# Patient Record
Sex: Female | Born: 1975 | Hispanic: No | Marital: Married
Health system: Southern US, Community
[De-identification: ages and names within clinical notes are randomized; demographics above are authoritative.]

---

## 2000-01-14 ENCOUNTER — Inpatient Hospital Stay (HOSPITAL_COMMUNITY): Admission: AD | Admit: 2000-01-14 | Discharge: 2000-01-16 | Payer: Self-pay | Admitting: Obstetrics and Gynecology

## 2000-04-18 ENCOUNTER — Other Ambulatory Visit: Admission: RE | Admit: 2000-04-18 | Discharge: 2000-04-18 | Payer: Self-pay | Admitting: Obstetrics and Gynecology

## 2000-11-24 ENCOUNTER — Other Ambulatory Visit: Admission: RE | Admit: 2000-11-24 | Discharge: 2000-11-24 | Payer: Self-pay | Admitting: Obstetrics and Gynecology

## 2001-07-28 ENCOUNTER — Other Ambulatory Visit: Admission: RE | Admit: 2001-07-28 | Discharge: 2001-07-28 | Payer: Self-pay | Admitting: Obstetrics and Gynecology

## 2001-08-31 ENCOUNTER — Encounter: Admission: RE | Admit: 2001-08-31 | Discharge: 2001-08-31 | Payer: Self-pay | Admitting: Family Medicine

## 2001-08-31 ENCOUNTER — Encounter: Payer: Self-pay | Admitting: Family Medicine

## 2002-02-26 ENCOUNTER — Other Ambulatory Visit: Admission: RE | Admit: 2002-02-26 | Discharge: 2002-02-26 | Payer: Self-pay | Admitting: Obstetrics and Gynecology

## 2004-04-17 ENCOUNTER — Ambulatory Visit: Payer: Self-pay | Admitting: Family Medicine

## 2004-04-20 ENCOUNTER — Ambulatory Visit: Payer: Self-pay | Admitting: Family Medicine

## 2016-11-02 IMAGING — CR DG HIP (WITH OR WITHOUT PELVIS) 2-3V*R*
2 series · 2 of 2 positions shown · non-contrast
Comparison: None.

CLINICAL DATA: Increasing right hip pain for several months. No
known injury.

EXAM:
DG HIP (WITH OR WITHOUT PELVIS) 2-3V RIGHT

[w hip ap right]
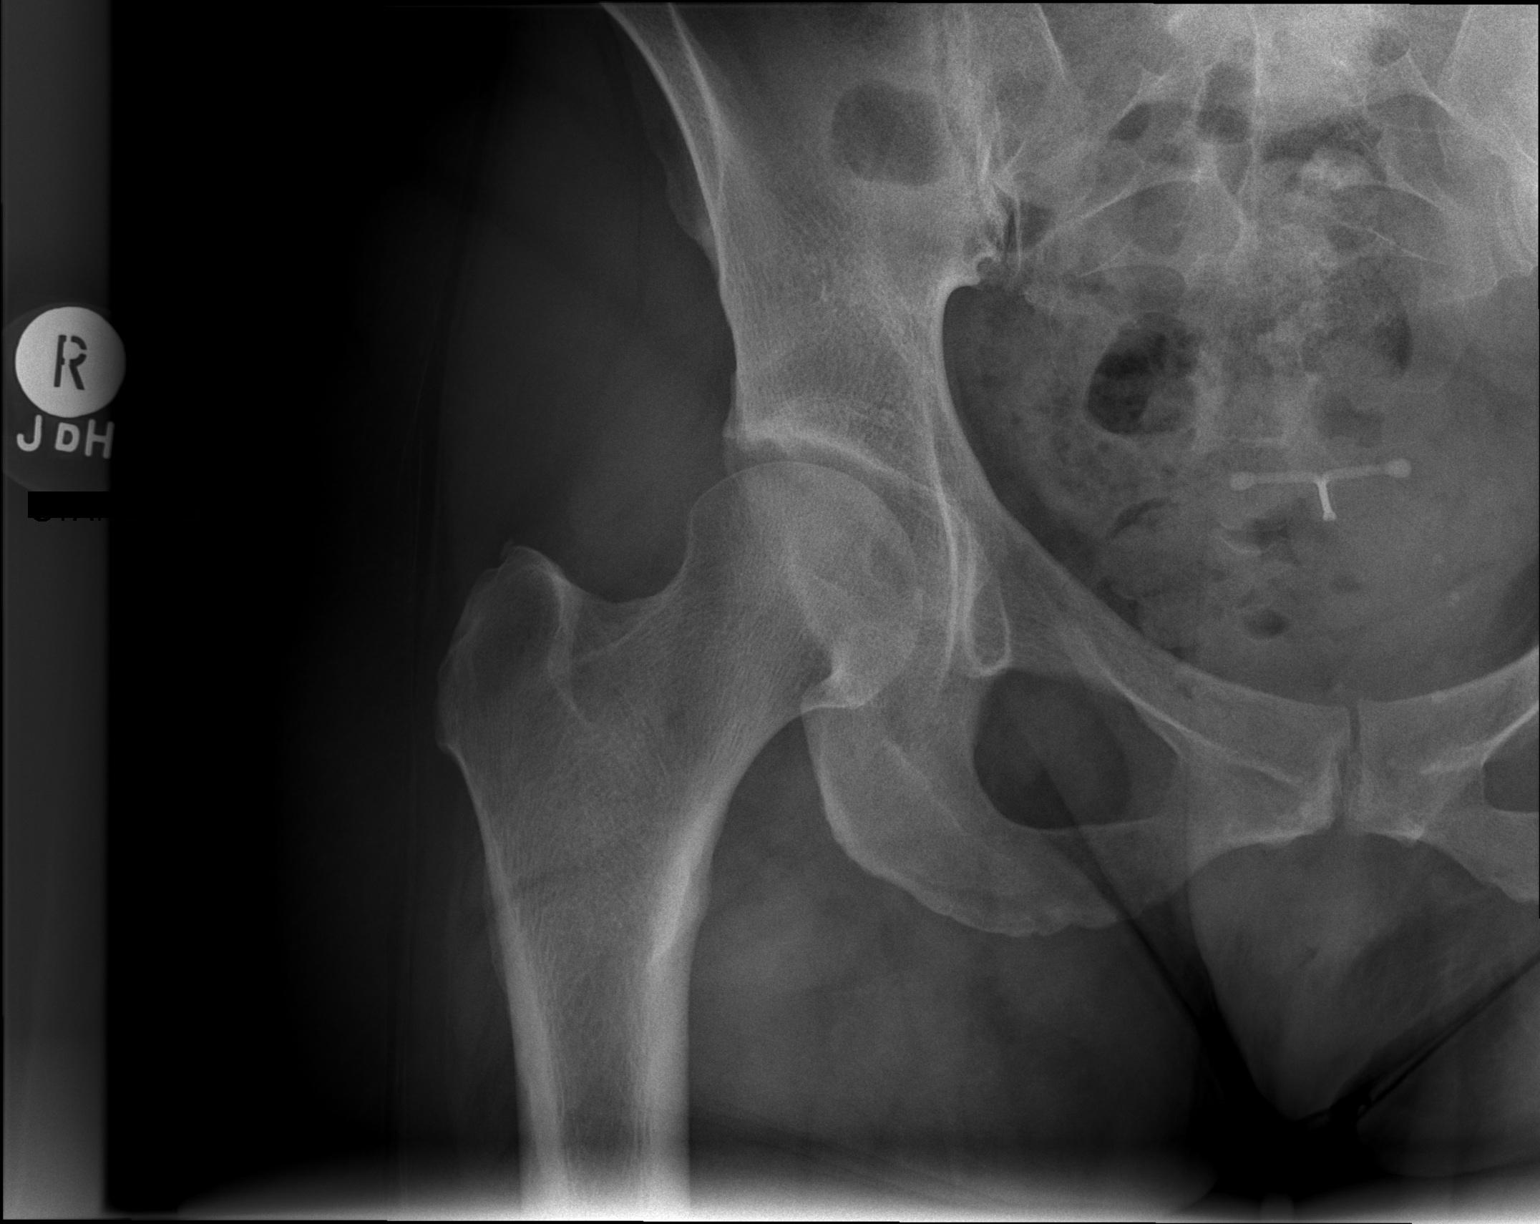

[w hip frog right]
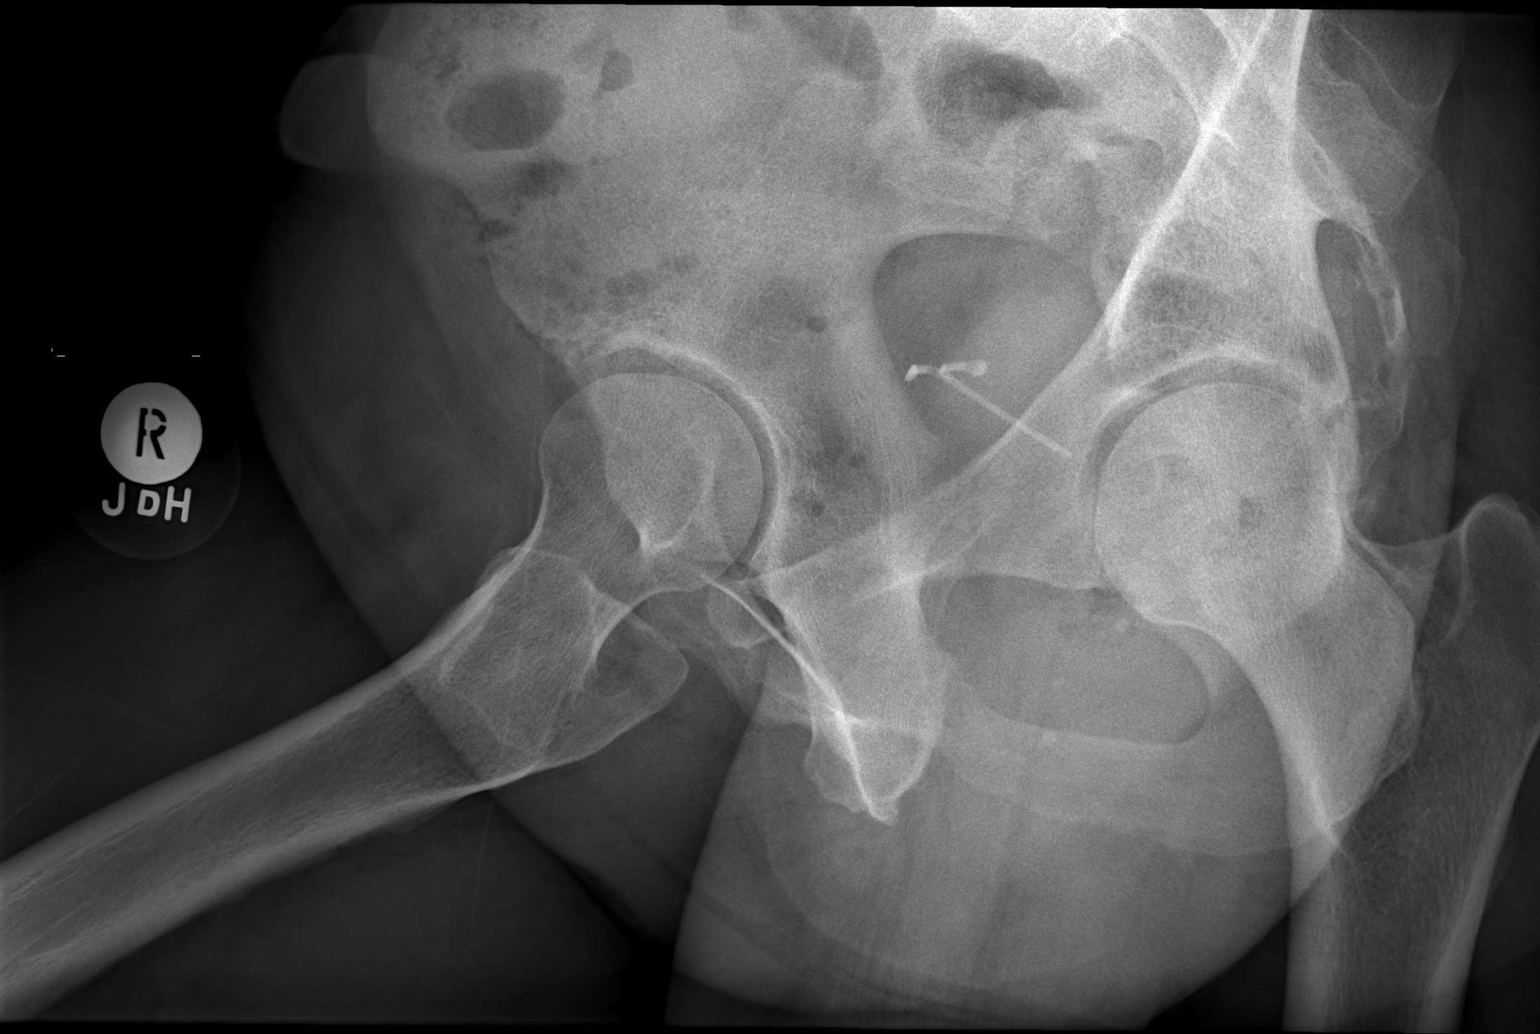

[2 of 2 positions shown; findings below may reference images not displayed]

FINDINGS: There is no evidence of hip fracture or dislocation. There is no
evidence of arthropathy or other focal bone abnormality. Pelvic IUD
noted.
IMPRESSION: Negative.

## 2018-04-01 ENCOUNTER — Other Ambulatory Visit: Payer: Self-pay | Admitting: Family Medicine

## 2018-04-01 ENCOUNTER — Ambulatory Visit
Admission: RE | Admit: 2018-04-01 | Discharge: 2018-04-01 | Disposition: A | Discharge status: Home / Self Care | Payer: Self-pay | Source: Ambulatory Visit | Attending: Family Medicine | Admitting: Family Medicine

## 2018-04-01 DIAGNOSIS — R52 Pain, unspecified: Secondary | ICD-10-CM

## 2018-04-01 IMAGING — CR DG LUMBAR SPINE COMPLETE 4+V
5 series · 5 of 5 positions shown · non-contrast
Comparison: None.

CLINICAL DATA: Chronic lumbago with right-sided radicular symptoms

EXAM:
LUMBAR SPINE - COMPLETE 4+ VIEW

[w lumbar spine ap]
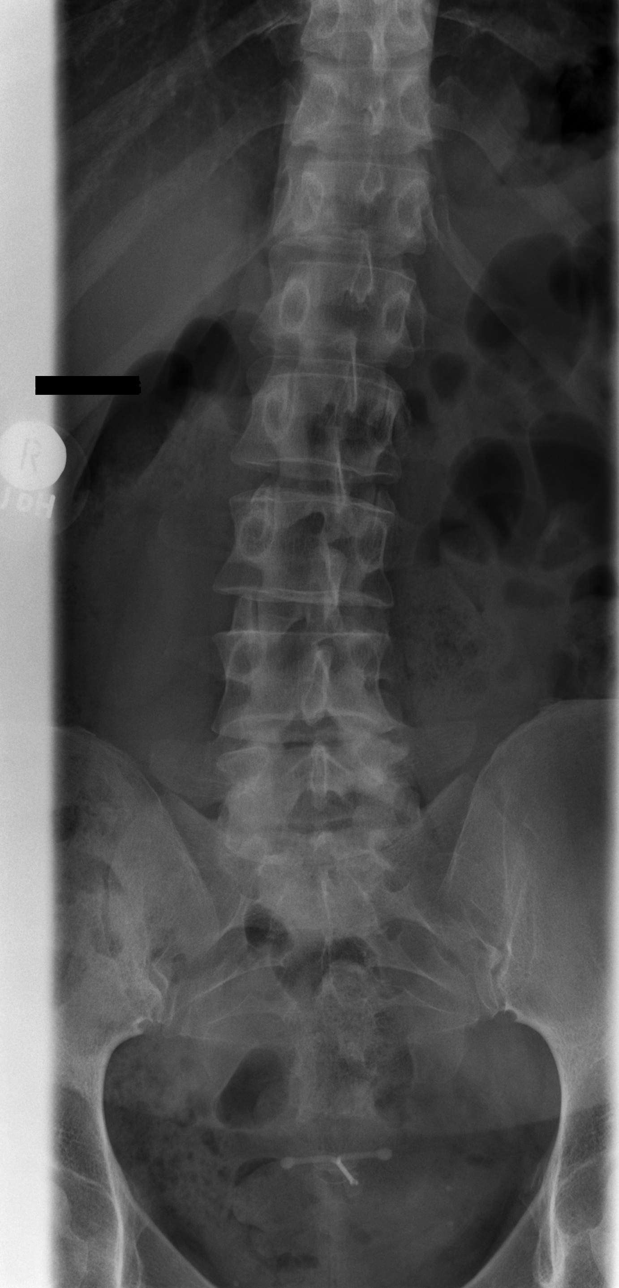

[w lumbar spine obl (1 of 2)]
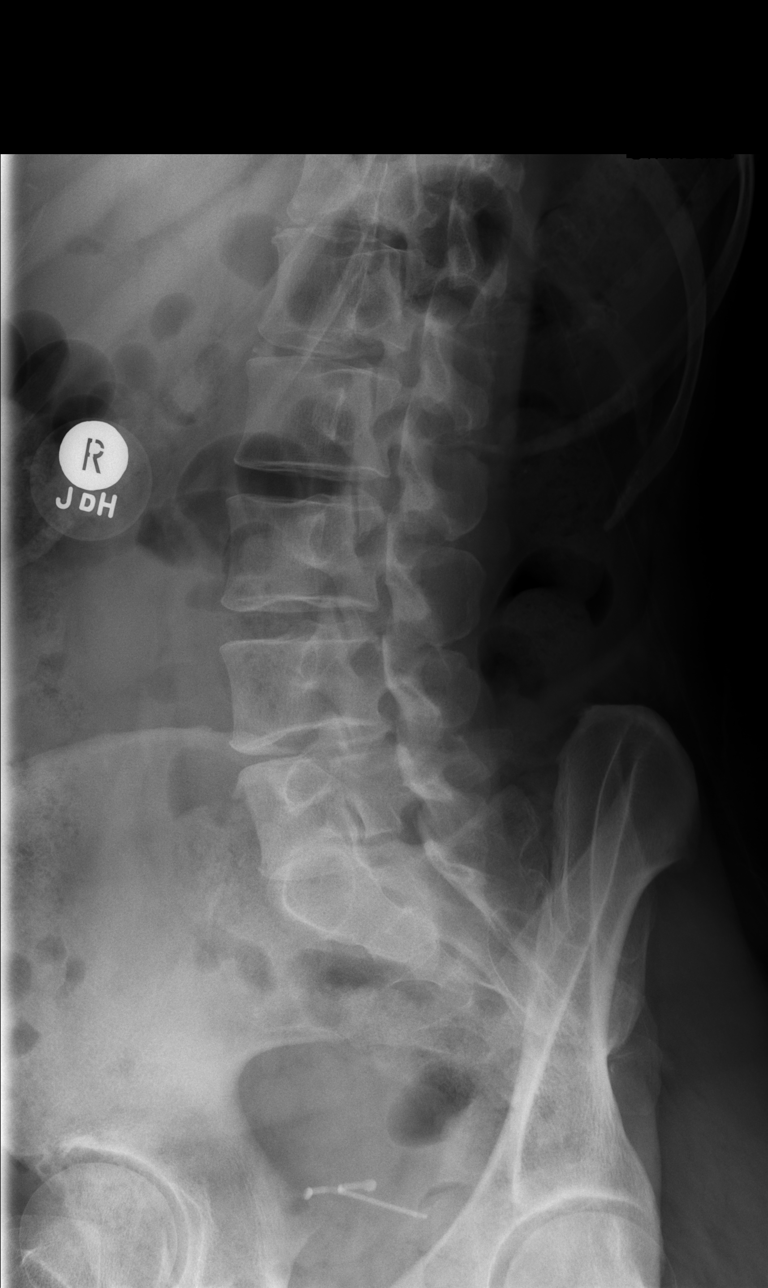

[w lumbar spine obl (2 of 2)]
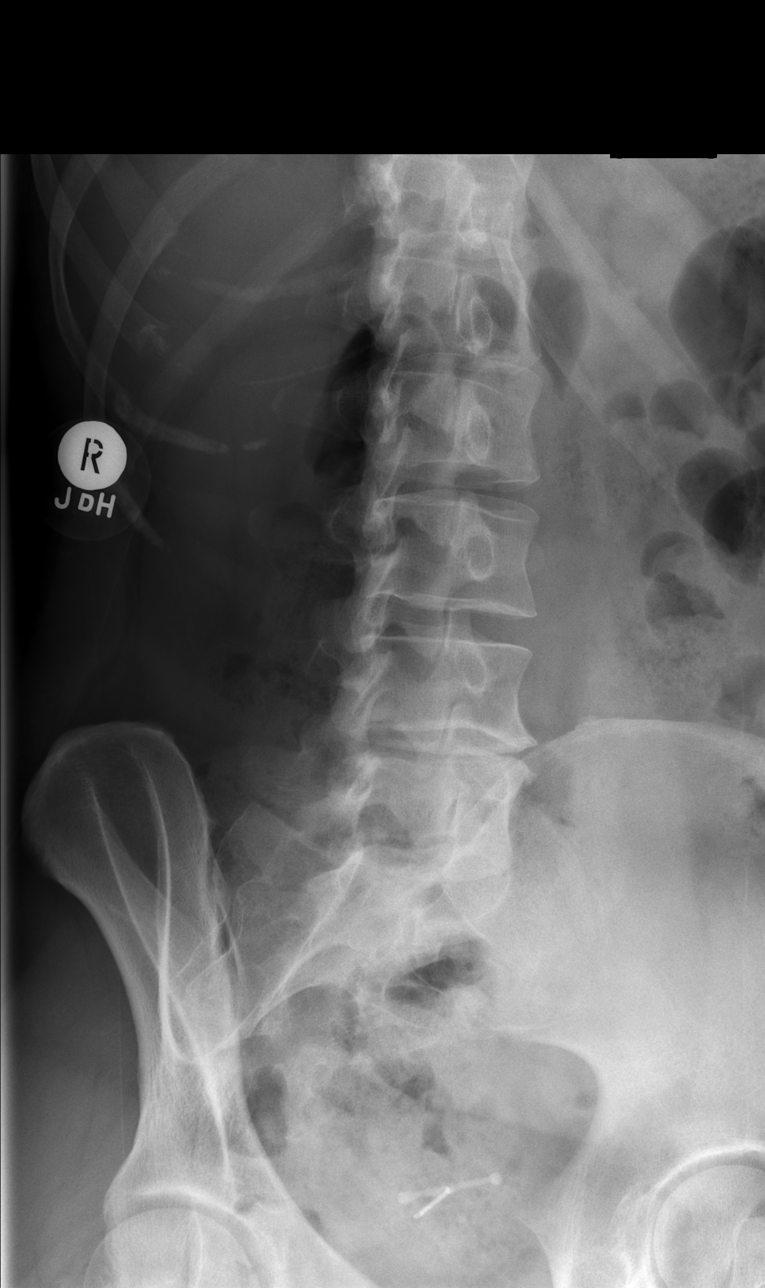

[w lumbar spine lat]
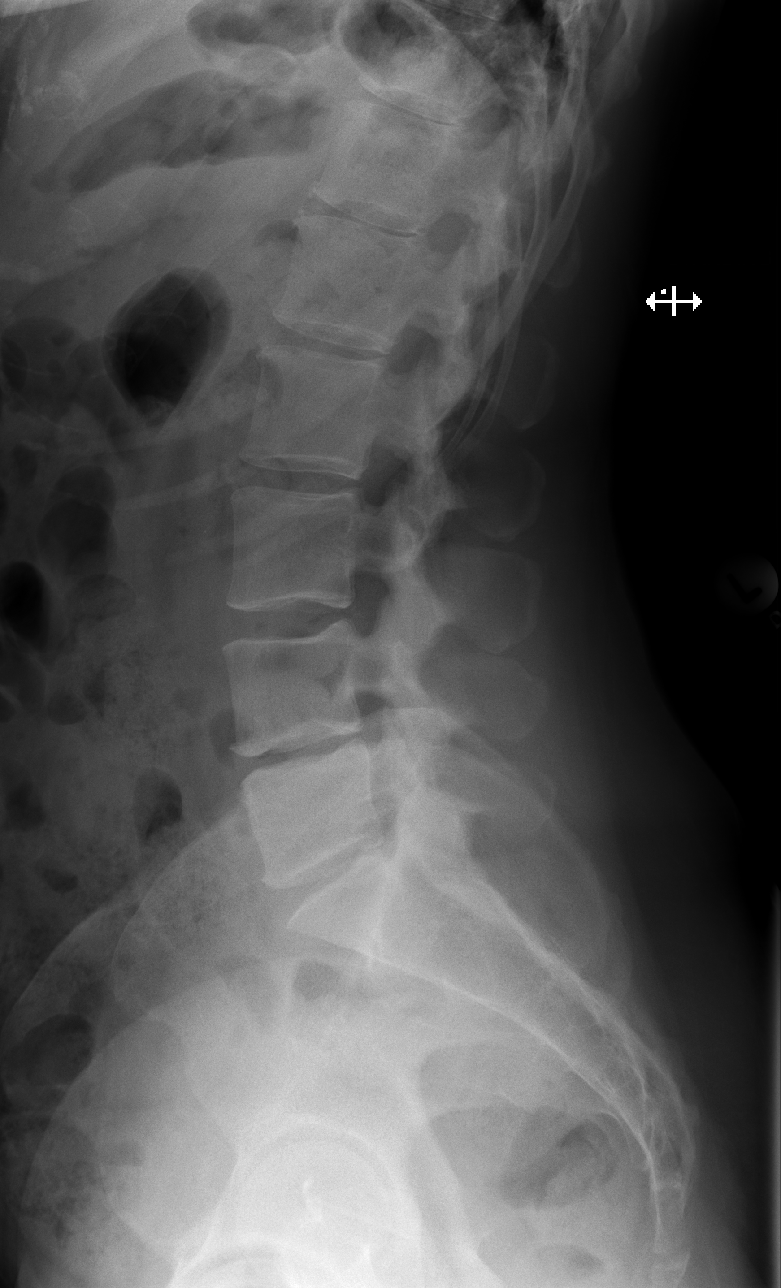

[w lumbar l-5 s-1 spot]
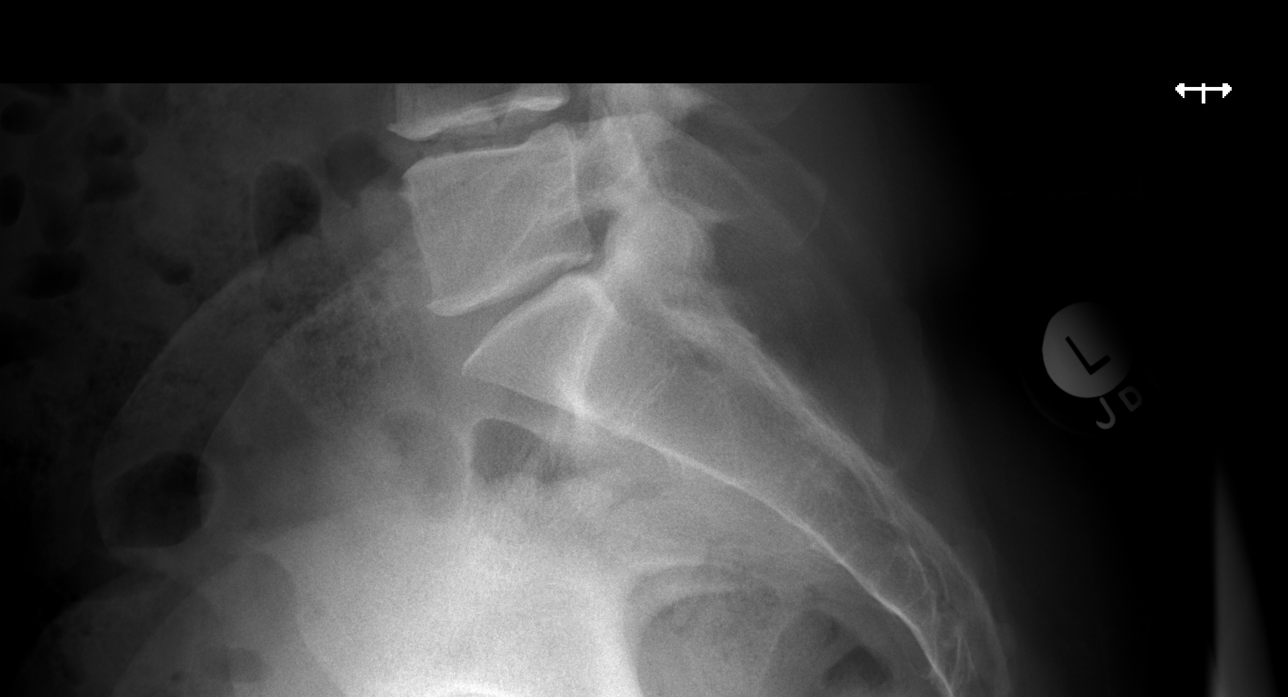

[5 of 5 positions shown; findings below may reference images not displayed]

FINDINGS: Standing frontal, standing lateral, standing spot lumbosacral
lateral, and standing bilateral oblique views were obtained. There
are 5 non-rib-bearing lumbar type vertebral bodies. There is lower
lumbar dextroscoliosis with mild rotatory component. There is no
demonstrable fracture. There is 2 mm of retrolisthesis of L5 on S1.
There is no other appreciable spondylolisthesis. There is moderately
severe disc space narrowing at L4-5 and L5-S1. Other disc spaces
appear unremarkable. There is facet osteoarthritic change on the
left at L5-S1.

There is an intrauterine device in the mid-pelvis.
IMPRESSION: Scoliosis with osteoarthritic change at L4-5 and L5-S1. Slight
spondylolisthesis at L5-S1 is felt to be due to underlying
spondylosis. No evident fracture.

Intrauterine device positioned in the mid-pelvis.

## 2018-09-02 ENCOUNTER — Other Ambulatory Visit (HOSPITAL_COMMUNITY): Payer: Self-pay | Admitting: Family Medicine

## 2018-09-02 ENCOUNTER — Other Ambulatory Visit: Payer: Self-pay | Admitting: Family Medicine

## 2018-09-02 DIAGNOSIS — M5416 Radiculopathy, lumbar region: Secondary | ICD-10-CM

## 2018-09-08 ENCOUNTER — Ambulatory Visit (HOSPITAL_COMMUNITY)
Admission: RE | Admit: 2018-09-08 | Discharge: 2018-09-08 | Disposition: A | Discharge status: Home / Self Care | Payer: No Typology Code available for payment source | Source: Ambulatory Visit | Attending: Family Medicine | Admitting: Family Medicine

## 2018-09-08 ENCOUNTER — Other Ambulatory Visit: Payer: Self-pay

## 2018-09-08 ENCOUNTER — Encounter (HOSPITAL_COMMUNITY): Payer: Self-pay

## 2018-09-08 DIAGNOSIS — M5416 Radiculopathy, lumbar region: Secondary | ICD-10-CM

## 2018-09-09 ENCOUNTER — Ambulatory Visit (HOSPITAL_COMMUNITY)
Admission: RE | Admit: 2018-09-09 | Discharge: 2018-09-09 | Disposition: A | Discharge status: Home / Self Care | Payer: No Typology Code available for payment source | Source: Ambulatory Visit | Attending: Family Medicine | Admitting: Family Medicine

## 2018-09-09 DIAGNOSIS — M5416 Radiculopathy, lumbar region: Secondary | ICD-10-CM | POA: Diagnosis not present

## 2018-09-09 IMAGING — MR MRI LUMBAR SPINE WITHOUT CONTRAST
4 of 5 series · 18 of 48 positions shown · non-contrast
Comparison: Prior radiographs from [DATE].

CLINICAL DATA: Initial evaluation for chronic low back pain with
numbness extending into the right lower extremity.

EXAM:
MRI LUMBAR SPINE WITHOUT CONTRAST
TECHNIQUE: Multiplanar, multisequence MR imaging of the lumbar spine was
performed. No intravenous contrast was administered.

[Series 3: T1 · sagittal · 4.0mm · 0.51mm/px · 3 of 12 slices shown (1 of 2)]
[im 3/12]
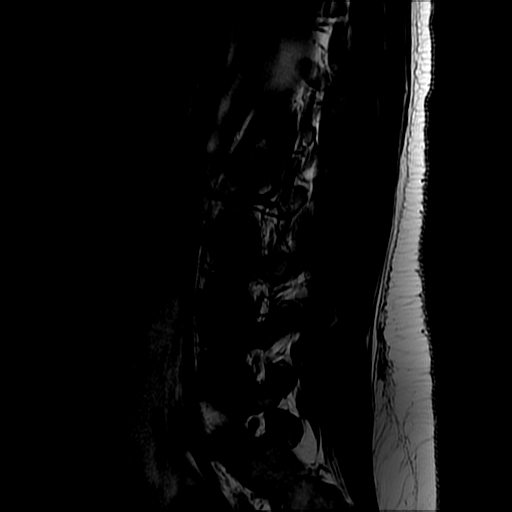
[im 7/12]
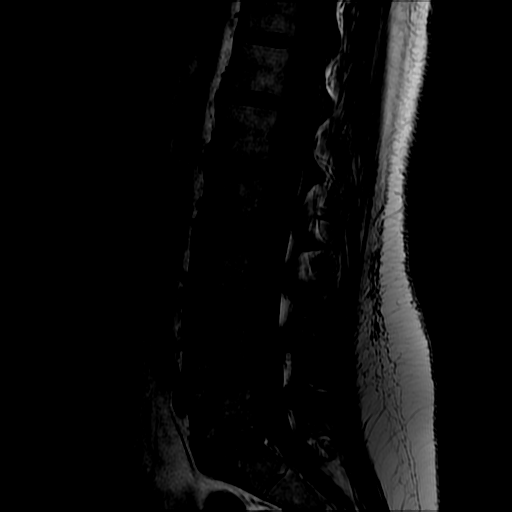
[im 12/12]
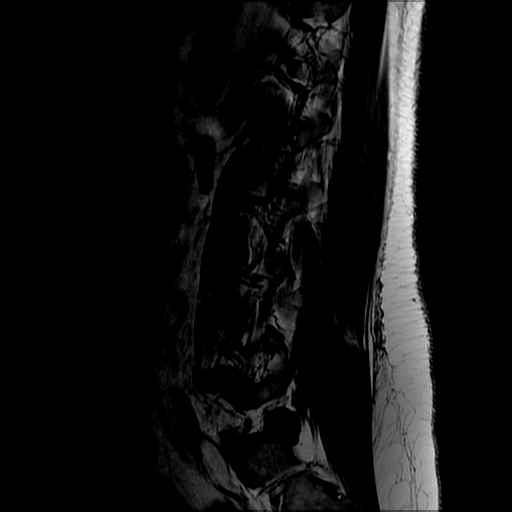

[Series 4: T2 post-contrast · sagittal · 4.0mm · 0.51mm/px · 5 of 12 slices shown]
[im 1/12]
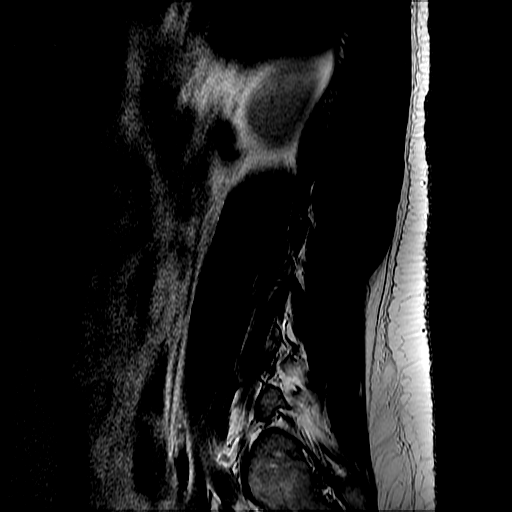
[im 3/12]
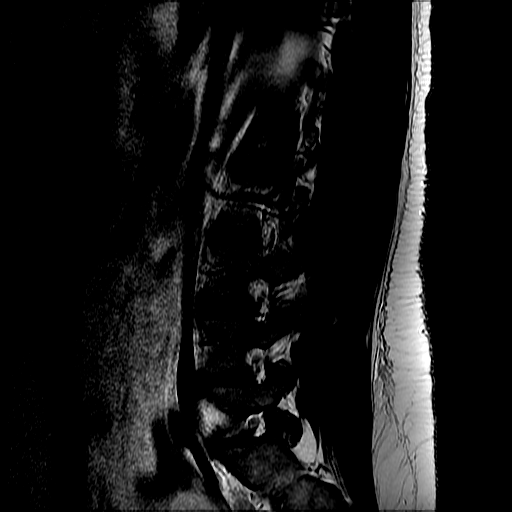
[im 6/12]
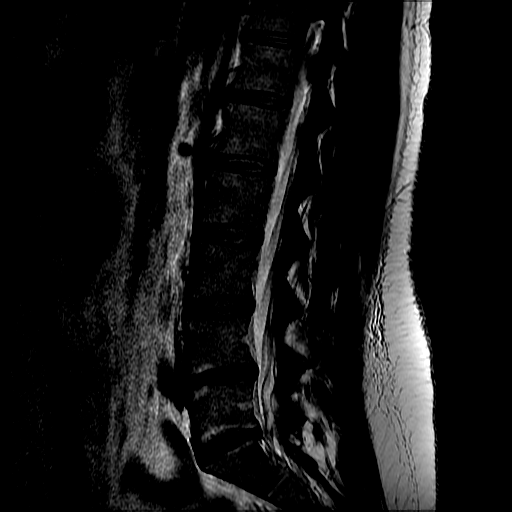
[im 9/12]
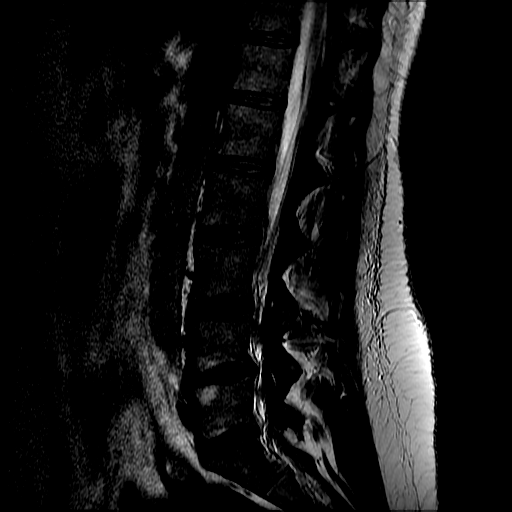
[im 12/12]
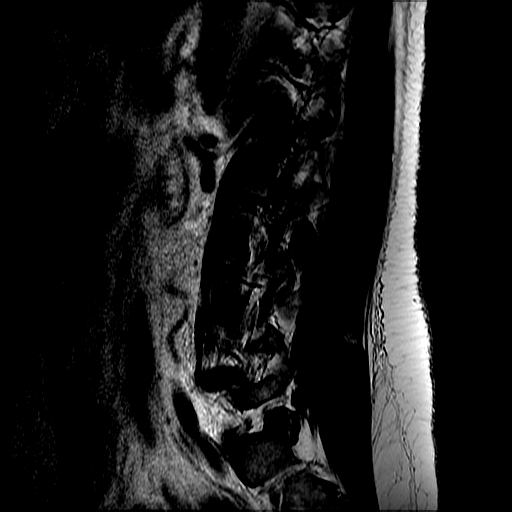

[Series 6: T2 · axial · 4.0mm · 0.39mm/px · z∈[-113,+67]mm · 7 of 37 slices shown]
[im 3/37]
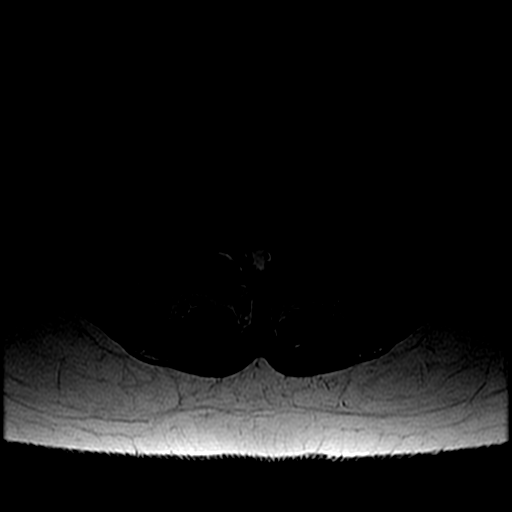
[im 5/37]
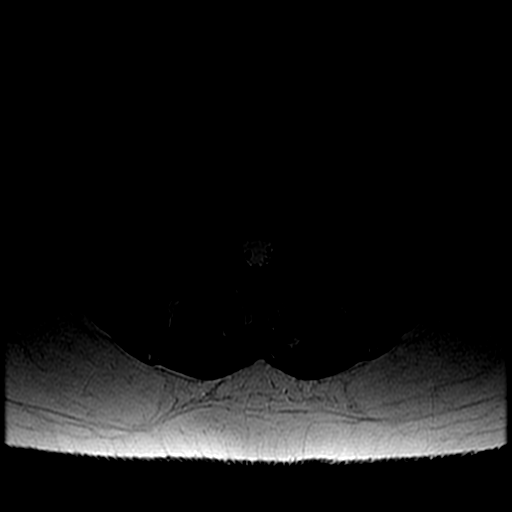
[im 8/37]
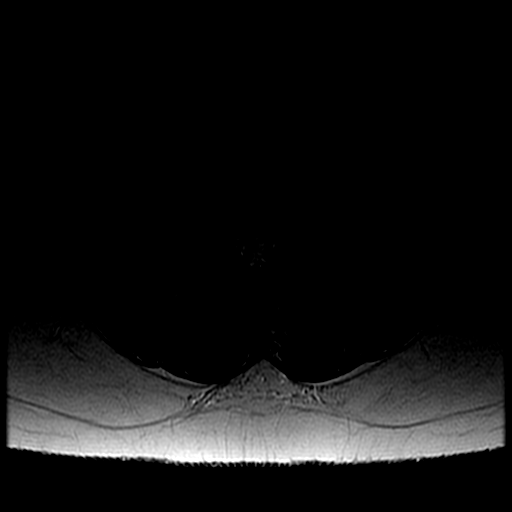
[im 13/37]
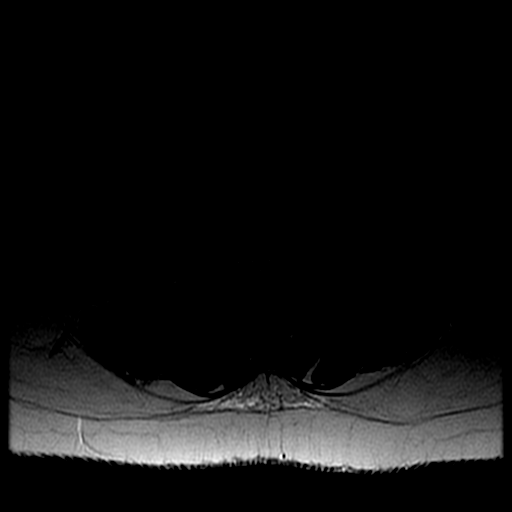
[im 17/37]
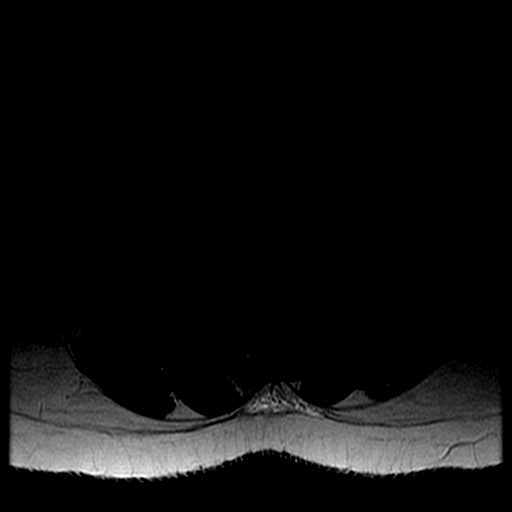
[im 20/37]
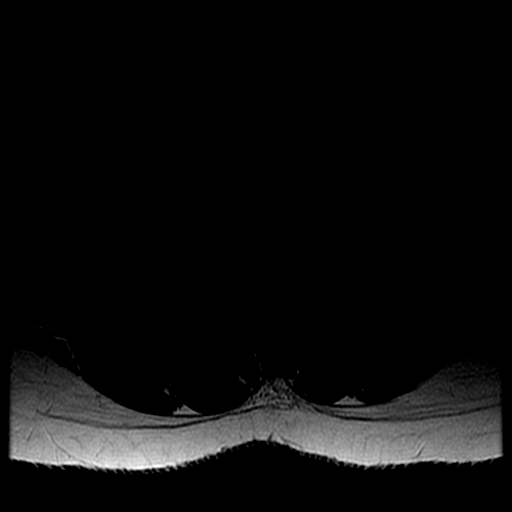
[im 32/37]
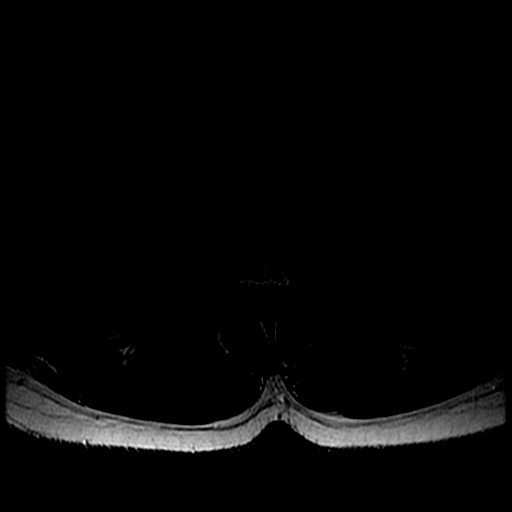

[Series 7: T1 · axial · 4.0mm · 0.39mm/px · z∈[-104,+67]mm · 3 of 37 slices shown (2 of 2)]
[im 5/37]
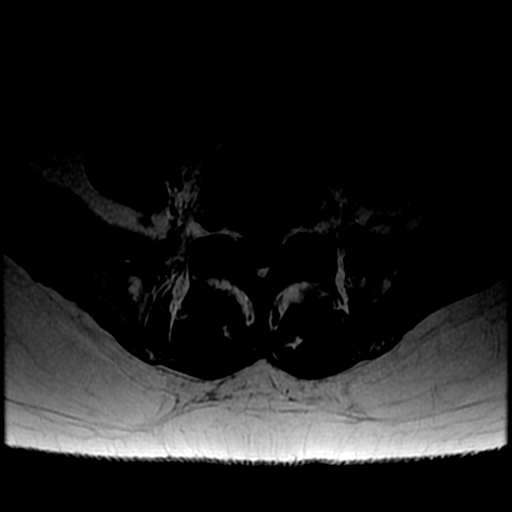
[im 20/37]
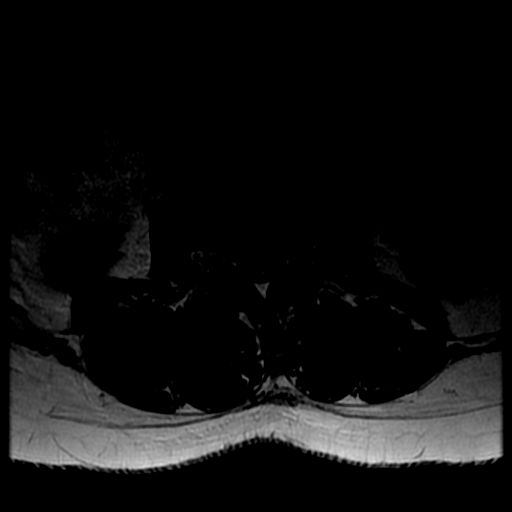
[im 32/37]
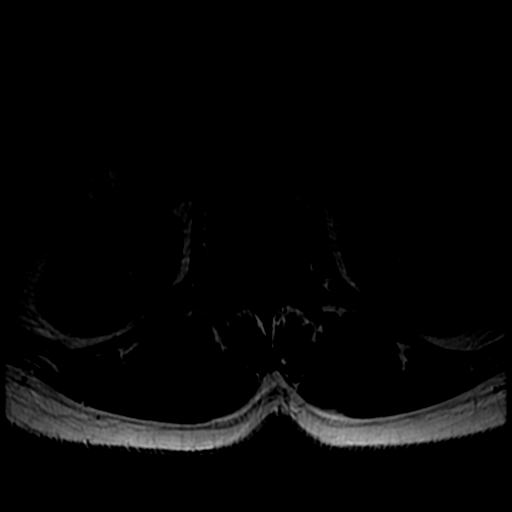

[18 of 48 positions shown; findings below may reference images not displayed]

FINDINGS: Segmentation: Standard. Lowest well-formed disc labeled the L5-S1
level.

Alignment: 3 mm retrolisthesis of L5 on S1, with trace 2 mm
retrolisthesis of L4 on L5. alignment otherwise normal with
preservation of the normal lumbar lordosis.

Vertebrae: Vertebral body height maintained without evidence for
acute or chronic fracture. Bone marrow signal intensity within
normal limits. No discrete or worrisome osseous lesions. Mild
reactive endplate changes present about the L4-5 and L5-S1
interspaces. No other abnormal marrow edema.

Conus medullaris and cauda equina: Conus extends to the L1 level.
Conus and cauda equina appear normal.

Paraspinal and other soft tissues: Paraspinous soft tissues
demonstrate no acute finding. Subcentimeter T2 hyperintense right
renal cyst noted. Visualized visceral structures otherwise
unremarkable.

Disc levels:

L1-2:  Unremarkable.

L2-3:  Unremarkable.

L3-4: Tiny right foraminal disc protrusion closely approximates the
exiting right L3 nerve root without frank impingement (series 4,
image 5). No significant canal or lateral recess stenosis. Foramina
remain patent.

L4-5: Trace retrolisthesis. Chronic intervertebral disc space
narrowing with diffuse disc bulge and disc desiccation. Mild
reactive endplate changes. No significant canal or lateral recess
stenosis. Mild bilateral L4 foraminal narrowing, slightly worse on
the left.

L5-S1: Trace retrolisthesis. Diffuse disc bulge with disc
desiccation and intervertebral disc space narrowing. Mild reactive
endplate changes, greater on the right. Small posterior annular
fissure. Minimal facet hypertrophy. No significant canal or lateral
recess stenosis. Mild right L5 foraminal narrowing.
IMPRESSION: 1. Tiny right foraminal disc protrusion at L3-4, closely
approximating the exiting right L3 nerve root without neural
impingement.
2. Mild disc bulging with reactive endplate changes at L4-5 with
resultant mild bilateral L4 foraminal stenosis.
3. Mild right L5 foraminal narrowing related to disc bulge and
reactive endplate changes.

## 2019-05-19 ENCOUNTER — Other Ambulatory Visit: Payer: Self-pay | Admitting: Neurological Surgery

## 2019-05-19 ENCOUNTER — Other Ambulatory Visit (HOSPITAL_COMMUNITY): Payer: Self-pay | Admitting: Neurological Surgery

## 2019-05-19 DIAGNOSIS — M5116 Intervertebral disc disorders with radiculopathy, lumbar region: Secondary | ICD-10-CM

## 2019-05-24 ENCOUNTER — Ambulatory Visit (HOSPITAL_COMMUNITY)
Admission: RE | Admit: 2019-05-24 | Discharge: 2019-05-24 | Disposition: A | Discharge status: Home / Self Care | Payer: No Typology Code available for payment source | Source: Ambulatory Visit | Attending: Neurological Surgery | Admitting: Neurological Surgery

## 2019-05-24 ENCOUNTER — Other Ambulatory Visit: Payer: Self-pay

## 2019-05-24 DIAGNOSIS — M5116 Intervertebral disc disorders with radiculopathy, lumbar region: Secondary | ICD-10-CM | POA: Diagnosis present

## 2019-05-24 IMAGING — MR MR LUMBAR SPINE W/O CM
4 of 5 series · 29 of 48 positions shown · non-contrast
Comparison: MRI lumbar spine without contrast [DATE]
COMPARISON: MRI lumbar spine without contrast [DATE]

Addendum:
CLINICAL DATA: The back pain. Right lower extremity radiculopathy.
steroid injection [DATE]

EXAM:
MRI LUMBAR SPINE WITHOUT CONTRAST
TECHNIQUE: Multiplanar, multisequence MR imaging of the lumbar spine was
performed. No intravenous contrast was administered.

[Series 5: T1 · sagittal · 4.0mm · 0.81mm/px · 6 of 15 slices shown (1 of 2)]
[im 1/15]
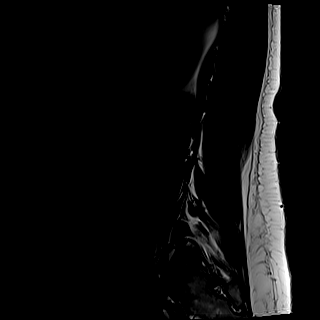
[im 3/15]
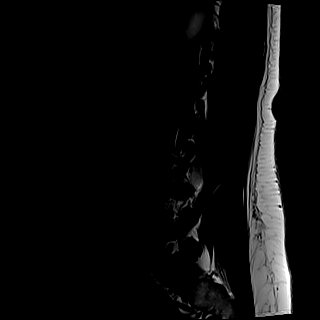
[im 6/15]
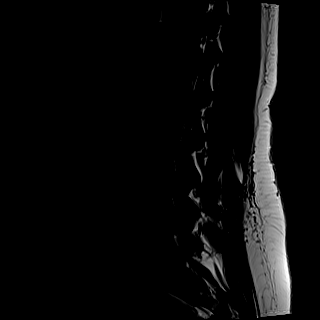
[im 9/15]
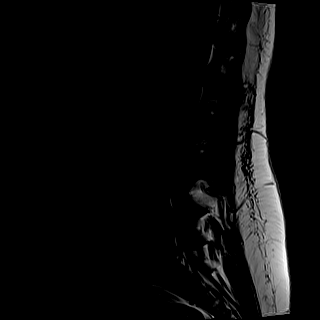
[im 12/15]
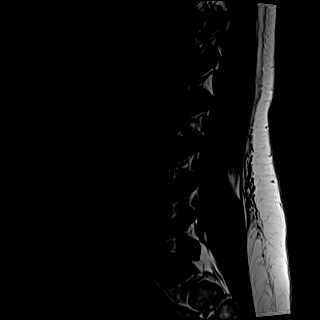
[im 15/15]
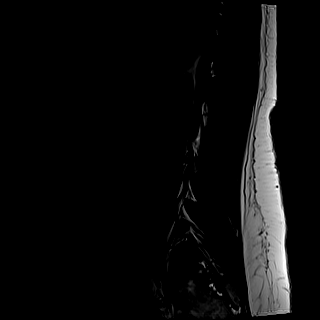

[Series 6: T2 · sagittal · 4.0mm · 0.81mm/px · 6 of 15 slices shown (1 of 2)]
[im 1/15]
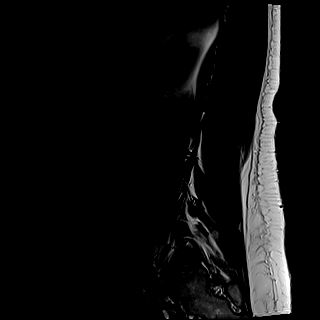
[im 3/15]
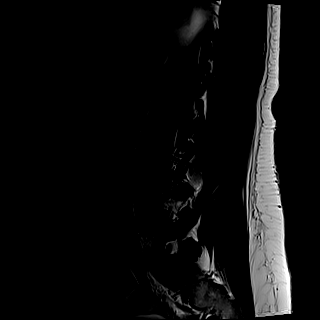
[im 6/15]
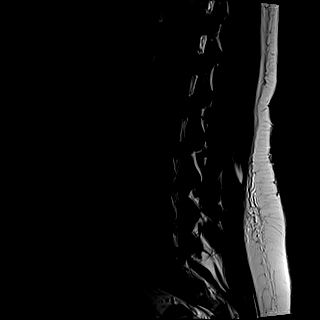
[im 9/15]
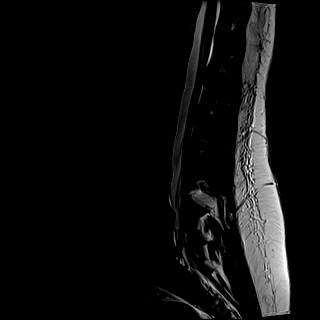
[im 12/15]
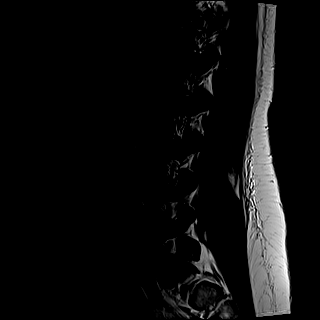
[im 15/15]
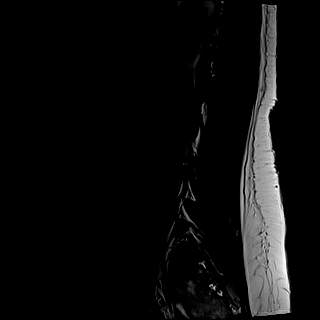

[Series 8: T2 · axial · 4.0mm · 0.62mm/px · z∈[-147,+79]mm · 9 of 41 slices shown (2 of 2)]
[im 1/41]
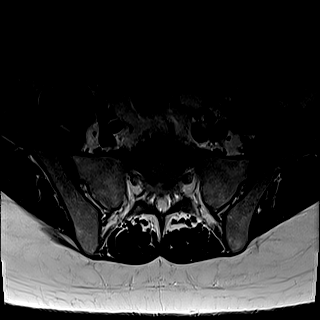
[im 6/41]
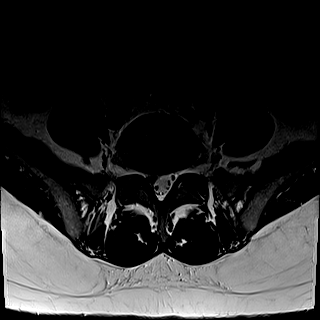
[im 12/41]
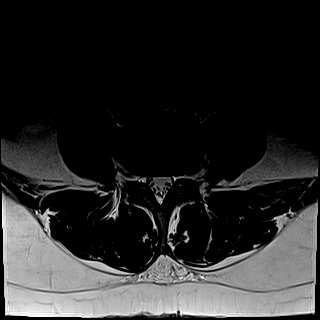
[im 18/41]
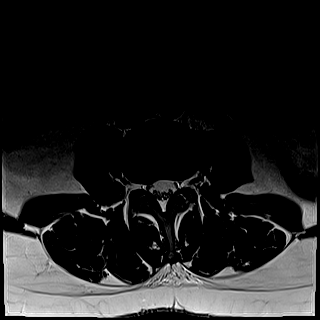
[im 21/41]
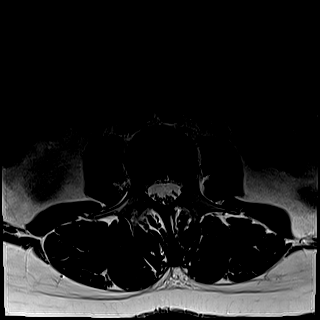
[im 23/41]
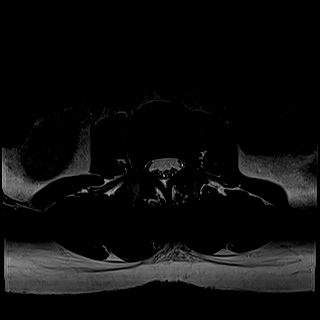
[im 29/41]
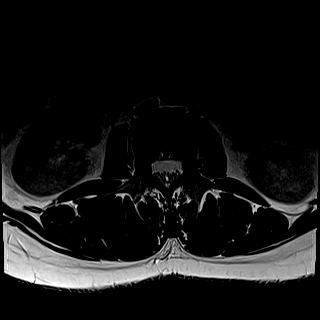
[im 35/41]
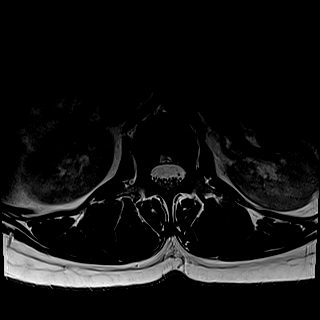
[im 41/41]
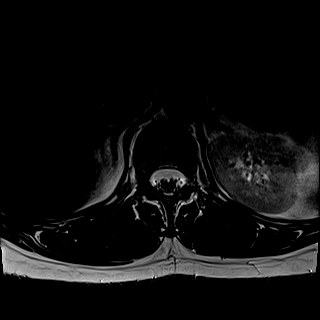

[Series 9: T1 · axial · 4.0mm · 0.43mm/px · z∈[-148,+50]mm · 8 of 41 slices shown (2 of 2)]
[im 1/41]
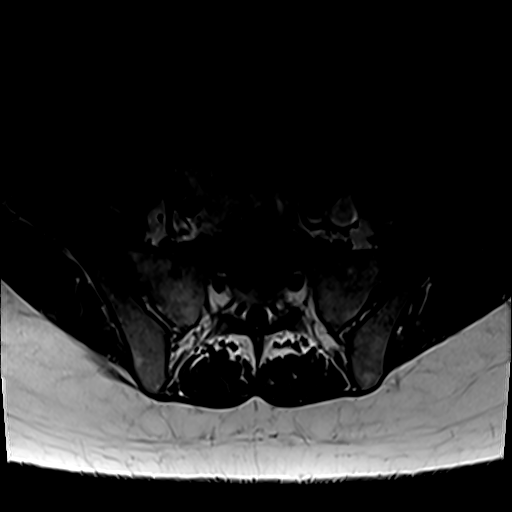
[im 6/41]
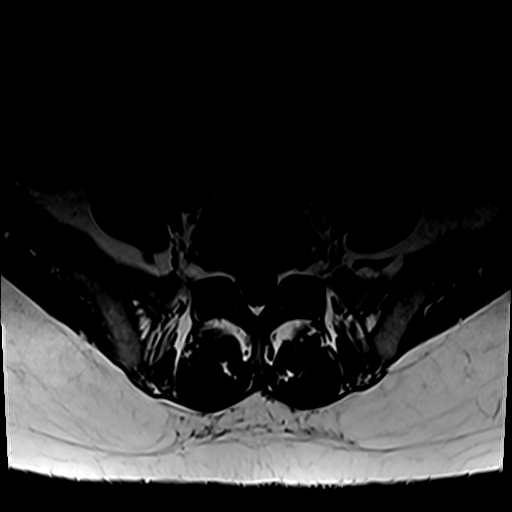
[im 12/41]
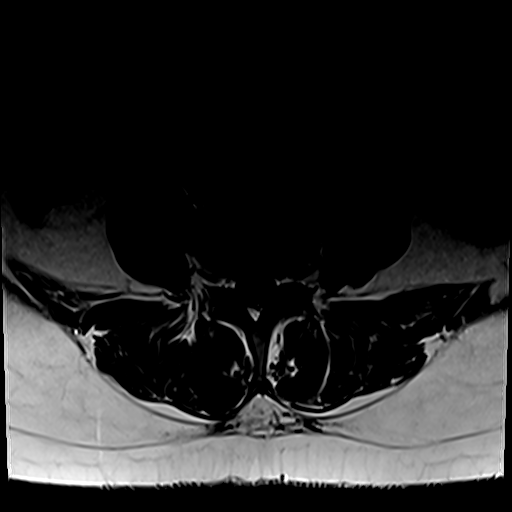
[im 18/41]
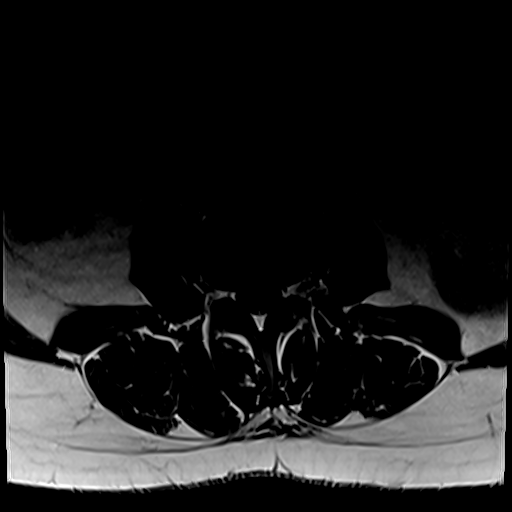
[im 21/41]
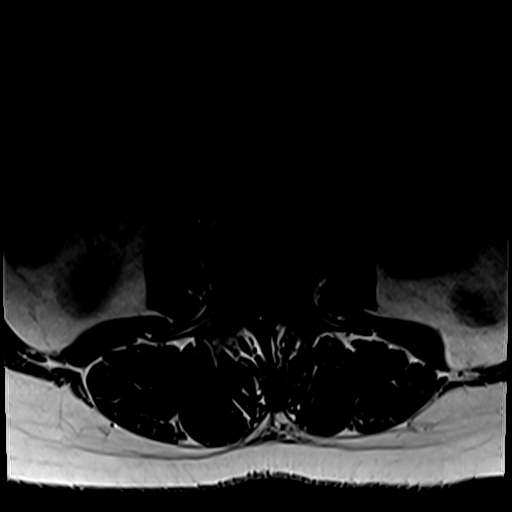
[im 23/41]
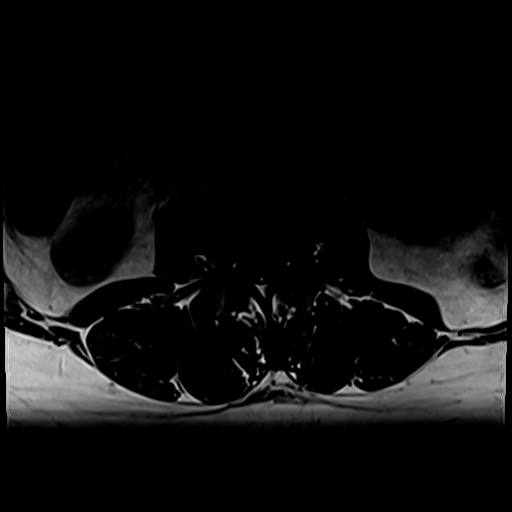
[im 29/41]
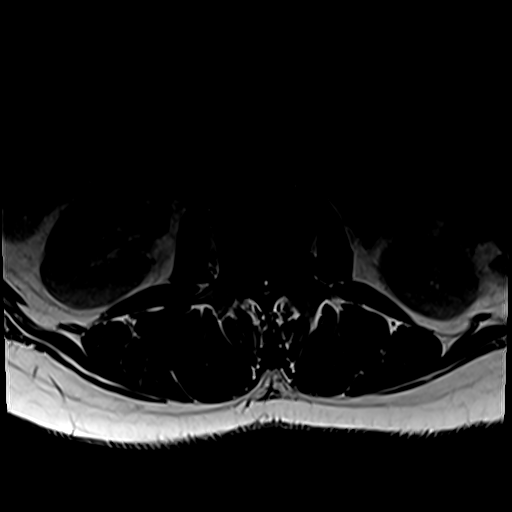
[im 35/41]
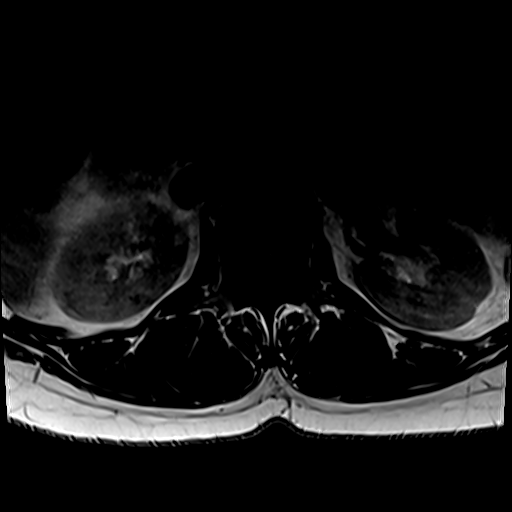

[29 of 48 positions shown; findings below may reference images not displayed]

FINDINGS: Segmentation: 5 non rib-bearing lumbar type vertebral bodies are
present. The lowest fully formed vertebral body is L5.

Alignment: No significant listhesis is present. There is some
straightening of the normal lumbar lordosis.

Vertebrae: Chronic fatty endplate marrow changes at L4-5 and L5-S1
are stable.

Conus medullaris and cauda equina: Conus extends to the L1 level.
Conus and cauda equina appear normal.

Paraspinal and other soft tissues: Limited imaging the abdomen is
unremarkable. There is no significant adenopathy. No solid organ
lesions are present.

Disc levels:

L1-2: Negative.

L2-3: Negative.

L3-4: A shallow right lateral disc protrusion is again seen. No
significant stenosis or change is present.

L4-5: A broad-based disc protrusion is present. Left lateral annular
tear is present. Disc extends into the foramina bilaterally. Mild
left foraminal narrowing is stable.

L5-S1: A rightward disc protrusion and annular tear demonstrates
some progression. The right S1 nerve root is enlarged. Foramina are
patent bilaterally.
IMPRESSION: 1. Progressive broad-based disc protrusion at L5-S1 with central
annular tear.
2. Enlarged right S1 nerve root likely reflects inflammatory changes
secondary to the disc disease.
3. Stable mild left foraminal narrowing with far left lateral disc
annular tear at L4-5.

ADDENDUM:
I reviewed the images with Dr. KASSANA. The patient is experiencing
an S1 radiculopathy. The soft tissue in the subarticular space on
the right at L5-S1 likely represents the disc fragment rather than
swollen nerve roots. The nerve roots are displaced medially. The
disc fragment measures approximately 7 mm.

*** End of Addendum ***
FINDINGS: Segmentation: 5 non rib-bearing lumbar type vertebral bodies are
present. The lowest fully formed vertebral body is L5.

Alignment: No significant listhesis is present. There is some
straightening of the normal lumbar lordosis.

Vertebrae: Chronic fatty endplate marrow changes at L4-5 and L5-S1
are stable.

Conus medullaris and cauda equina: Conus extends to the L1 level.
Conus and cauda equina appear normal.

Paraspinal and other soft tissues: Limited imaging the abdomen is
unremarkable. There is no significant adenopathy. No solid organ
lesions are present.

Disc levels:

L1-2: Negative.

L2-3: Negative.

L3-4: A shallow right lateral disc protrusion is again seen. No
significant stenosis or change is present.

L4-5: A broad-based disc protrusion is present. Left lateral annular
tear is present. Disc extends into the foramina bilaterally. Mild
left foraminal narrowing is stable.

L5-S1: A rightward disc protrusion and annular tear demonstrates
some progression. The right S1 nerve root is enlarged. Foramina are
patent bilaterally.
IMPRESSION: 1. Progressive broad-based disc protrusion at L5-S1 with central
annular tear.
2. Enlarged right S1 nerve root likely reflects inflammatory changes
secondary to the disc disease.
3. Stable mild left foraminal narrowing with far left lateral disc
annular tear at L4-5.

## 2020-02-22 ENCOUNTER — Other Ambulatory Visit (HOSPITAL_COMMUNITY): Payer: Self-pay | Admitting: Neurological Surgery

## 2020-02-22 ENCOUNTER — Other Ambulatory Visit (HOSPITAL_COMMUNITY): Payer: Self-pay | Admitting: Specialist

## 2020-02-22 DIAGNOSIS — M5116 Intervertebral disc disorders with radiculopathy, lumbar region: Secondary | ICD-10-CM

## 2020-03-02 ENCOUNTER — Encounter (HOSPITAL_COMMUNITY): Payer: Self-pay

## 2020-03-02 ENCOUNTER — Ambulatory Visit (HOSPITAL_COMMUNITY)
Admission: RE | Admit: 2020-03-02 | Discharge: 2020-03-02 | Disposition: A | Discharge status: Home / Self Care | Payer: No Typology Code available for payment source | Source: Ambulatory Visit | Attending: Neurological Surgery | Admitting: Neurological Surgery

## 2020-03-02 ENCOUNTER — Ambulatory Visit (HOSPITAL_COMMUNITY): Admission: RE | Admit: 2020-03-02 | Payer: No Typology Code available for payment source | Source: Ambulatory Visit

## 2020-03-02 DIAGNOSIS — M5116 Intervertebral disc disorders with radiculopathy, lumbar region: Secondary | ICD-10-CM | POA: Diagnosis not present

## 2020-03-02 IMAGING — MR MR LUMBAR SPINE WO/W CM
6 of 7 series · 31 of 48 positions shown · IV contrast (gadavist)
Comparison: [DATE]

CLINICAL DATA: Low back pain radiating down the right leg with
numbness, tingling, and burning. Prior lumbar surgery.

EXAM:
MRI LUMBAR SPINE WITHOUT AND WITH CONTRAST
TECHNIQUE: Multiplanar and multiecho pulse sequences of the lumbar spine were
obtained without and with intravenous contrast.
CONTRAST:  7mL GADAVIST GADOBUTROL 1 MMOL/ML IV SOLN

[Series 5: T1 · sagittal · 4.0mm · 0.81mm/px · 4 of 17 slices shown (1 of 2)]
[im 1/17]
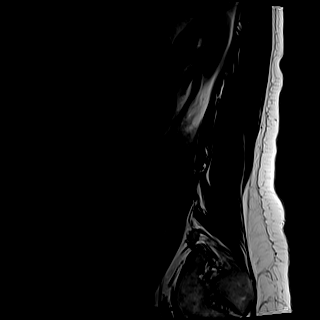
[im 6/17]
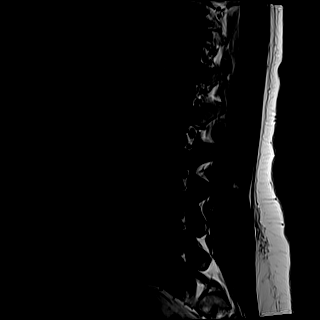
[im 11/17]
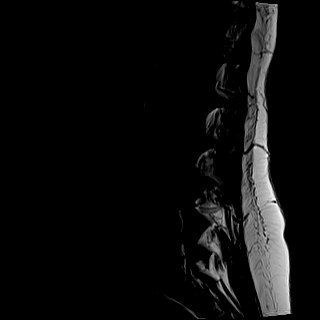
[im 17/17]
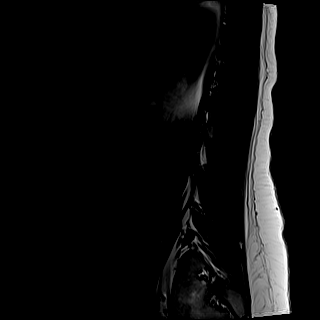

[Series 6: STIR · sagittal · 4.0mm · 0.51mm/px · 1 of 17 slices shown]
[im 1/17]
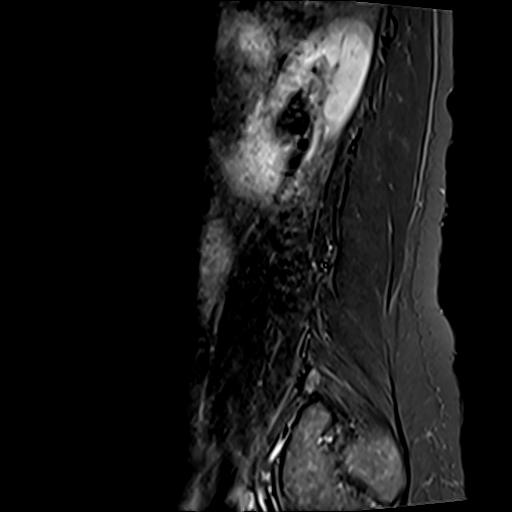

[Series 7: T2 · axial · 4.0mm · 0.62mm/px · z∈[-32,+177]mm · 8 of 37 slices shown]
[im 1/37]
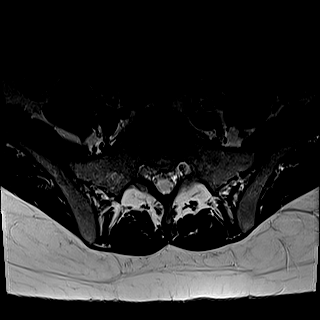
[im 5/37]
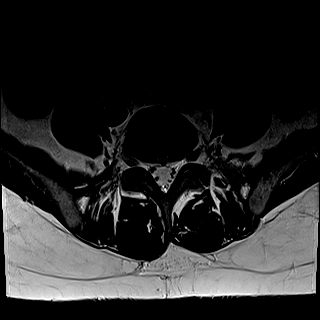
[im 13/37]
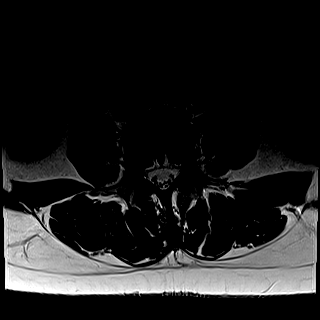
[im 17/37]
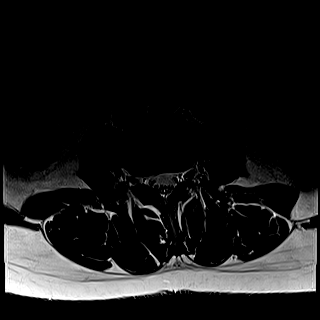
[im 21/37]
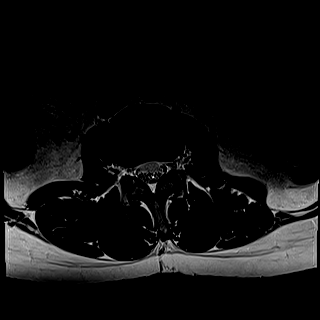
[im 25/37]
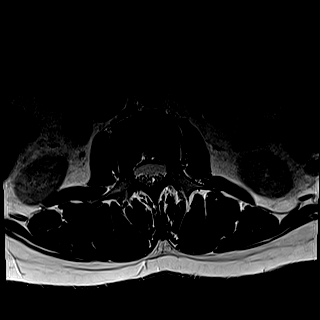
[im 33/37]
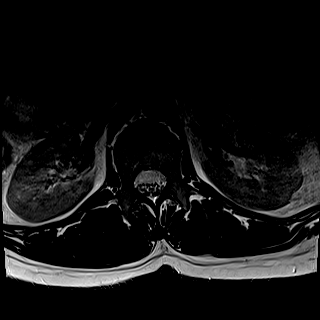
[im 37/37]
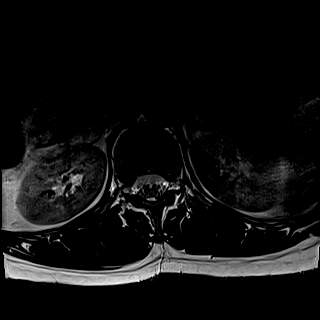

[Series 8: T1 · axial · 4.0mm · 0.39mm/px · z∈[-32,+177]mm · 8 of 37 slices shown (2 of 2)]
[im 1/37]
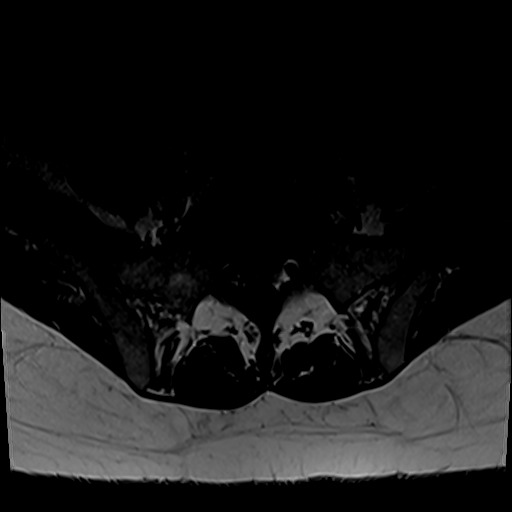
[im 5/37]
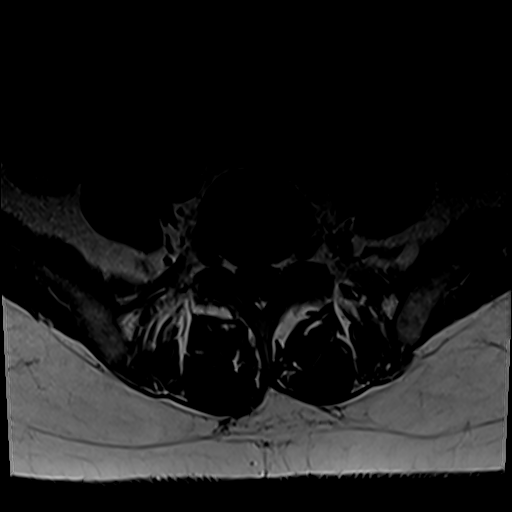
[im 13/37]
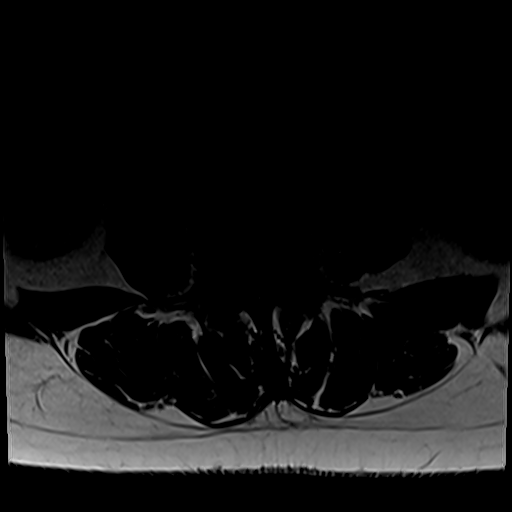
[im 17/37]
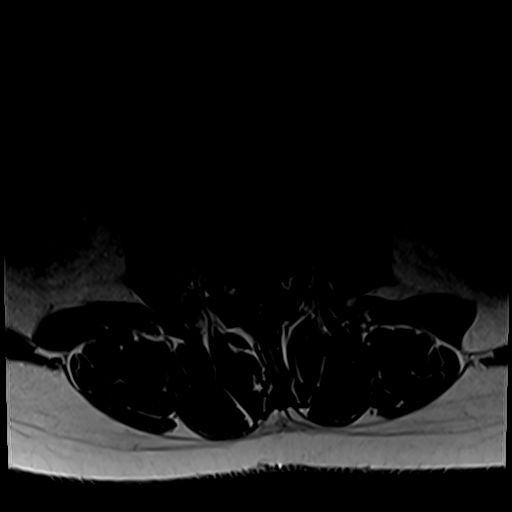
[im 21/37]
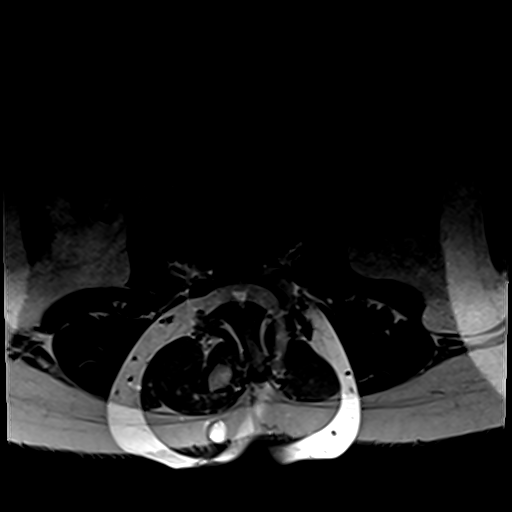
[im 25/37]
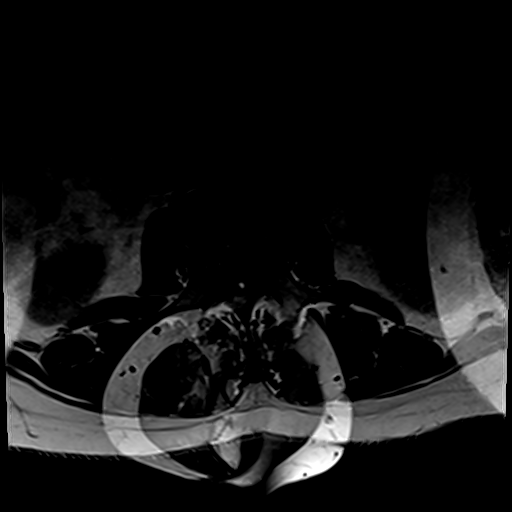
[im 33/37]
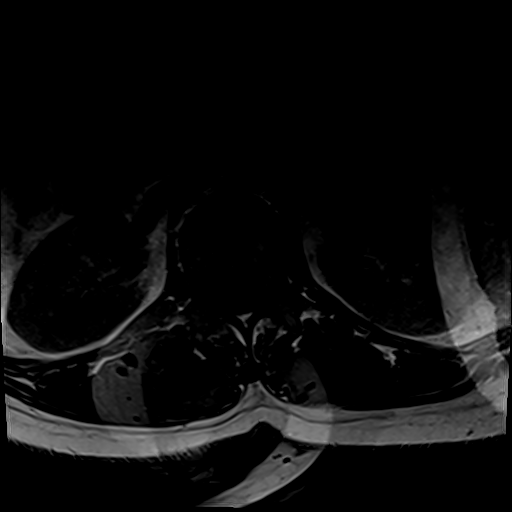
[im 37/37]
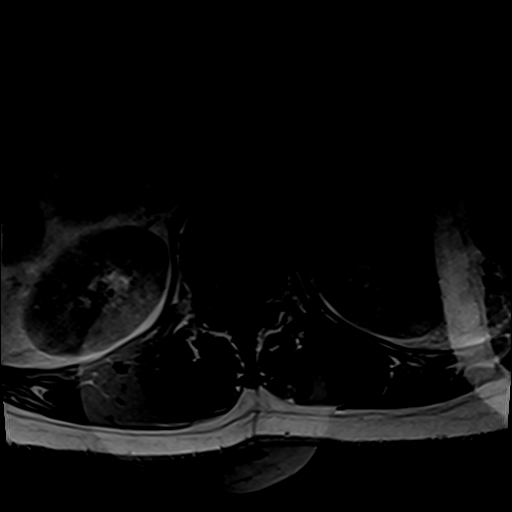

[Series 9: T2 post-contrast · sagittal · 4.0mm · 0.81mm/px · 5 of 17 slices shown]
[im 1/17]
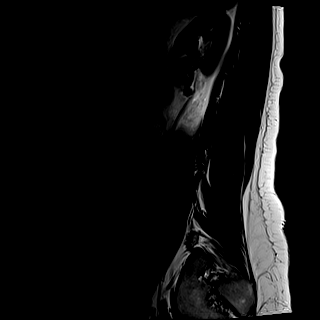
[im 5/17]
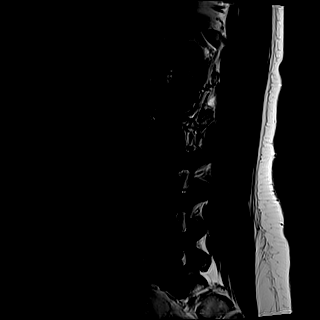
[im 9/17]
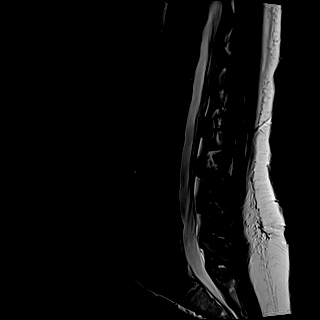
[im 13/17]
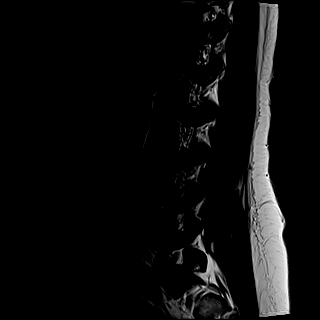
[im 17/17]
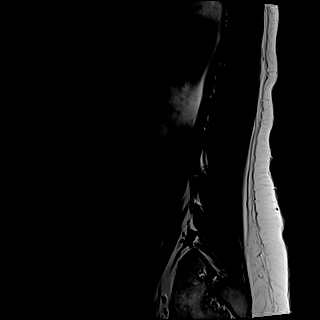

[Series 10: T1 fat-sat post-contrast · sagittal · 4.0mm · 0.81mm/px · 5 of 17 slices shown]
[im 1/17]
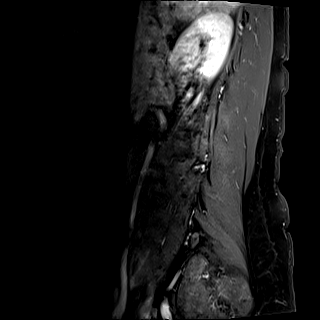
[im 5/17]
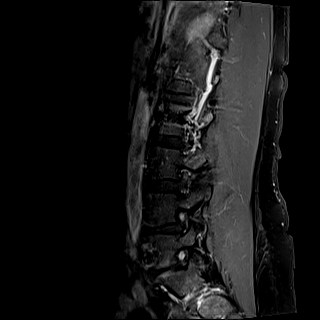
[im 9/17]
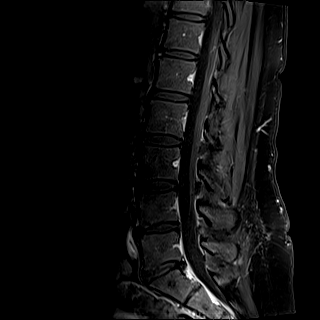
[im 13/17]
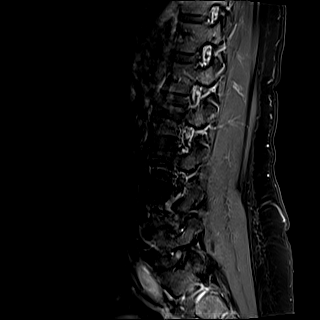
[im 17/17]
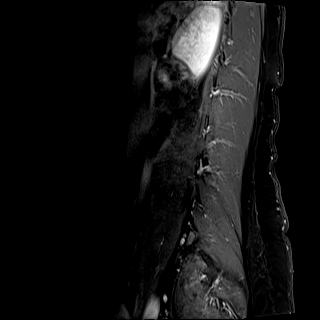

[31 of 48 positions shown; findings below may reference images not displayed]

FINDINGS: Segmentation:  Standard.

Alignment:  Unchanged trace retrolisthesis of L5 on S1.

Vertebrae: No fracture or suspicious osseous lesion. Mild
degenerative endplate changes at L4-5 and L5-S1, predominantly Modic
type 2.

Conus medullaris and cauda equina: Conus extends to the L1 level and
appears normal. A thin fatty filum is noted.

Paraspinal and other soft tissues: Unremarkable.

Disc levels:

Disc desiccation and moderate disc space narrowing at L4-5 and
L5-S1.

T12-L1 through L2-3: Negative.

L3-4: Shallow right subarticular disc protrusion without significant
stenosis, unchanged.

L4-5: Disc bulging mildly eccentric to the left, endplate spurring,
and disc space height loss result in mild left neural foraminal
stenosis without spinal stenosis, unchanged.

L5-S1: Interval right laminotomy and microdiscectomy. Small volume
enhancing right-sided epidural granulation tissue partially
surrounding the right S1 nerve root in the lateral recess. There is
mild disc bulging without evidence of a recurrent disc herniation or
stenosis.
IMPRESSION: 1. Interval microdiscectomy at L5-S1 without evidence of a recurrent
disc herniation or stenosis.
2. Unchanged mild left neural foraminal stenosis at L4-5.
3. Unchanged shallow right subarticular disc protrusion at L3-4
without stenosis.

## 2020-03-02 MED ORDER — GADOBUTROL 1 MMOL/ML IV SOLN
7.0000 mL | Freq: Once | INTRAVENOUS | Status: AC | PRN
  Administered 2020-03-02: 7 mL via INTRAVENOUS

## 2020-08-23 ENCOUNTER — Other Ambulatory Visit (HOSPITAL_COMMUNITY): Payer: Self-pay | Admitting: Neurological Surgery

## 2020-08-23 ENCOUNTER — Other Ambulatory Visit: Payer: Self-pay | Admitting: Neurological Surgery

## 2020-08-23 ENCOUNTER — Other Ambulatory Visit: Payer: Self-pay | Admitting: Family Medicine

## 2020-08-23 DIAGNOSIS — M5116 Intervertebral disc disorders with radiculopathy, lumbar region: Secondary | ICD-10-CM

## 2020-08-28 ENCOUNTER — Other Ambulatory Visit: Payer: Self-pay | Admitting: Home Modifications

## 2020-08-28 DIAGNOSIS — M7989 Other specified soft tissue disorders: Secondary | ICD-10-CM

## 2020-09-06 ENCOUNTER — Ambulatory Visit (HOSPITAL_COMMUNITY)
Admission: RE | Admit: 2020-09-06 | Discharge: 2020-09-06 | Disposition: A | Discharge status: Home / Self Care | Payer: No Typology Code available for payment source | Source: Ambulatory Visit | Attending: Neurological Surgery | Admitting: Neurological Surgery

## 2020-09-06 ENCOUNTER — Other Ambulatory Visit: Payer: Self-pay

## 2020-09-06 DIAGNOSIS — M5116 Intervertebral disc disorders with radiculopathy, lumbar region: Secondary | ICD-10-CM | POA: Insufficient documentation

## 2020-09-06 IMAGING — CT CT L SPINE W/ CM
2 of 24 series · 3 of 35 positions shown, 4 images · non-contrast
Comparison: Lumbar MRI [DATE], and earlier.

CLINICAL DATA: 45-year-old female with prior micro discectomy at
L5-S1. Persistent low back pain and right leg radiculopathy, thought
to be S1 distribution.

EXAM:
LUMBAR MYELOGRAM
FLUOROSCOPY TIME:  0 minutes and 42 seconds
PROCEDURE:
Lumbar puncture and intrathecal contrast administration were
performed by Dr. VENE who will separately report for the
portion of the procedure. I personally supervised acquisition of the
myelogram images.
TECHNIQUE: Contiguous axial images were obtained through the Lumbar spine after
the intrathecal infusion of infusion. Coronal and sagittal
reconstructions were obtained of the axial image sets.

[Series 4: orthogonal · axial · 0.21mm/px · z∈[+587,+587]mm · 1 of 123 slices shown, 2 images]
[im 1/123  soft-tissue]
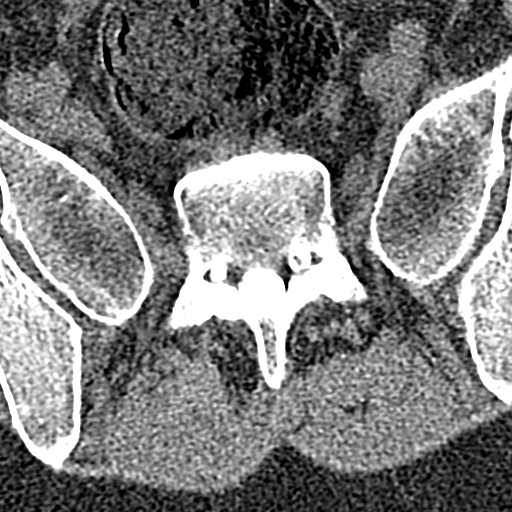
[im 1/123  bone]
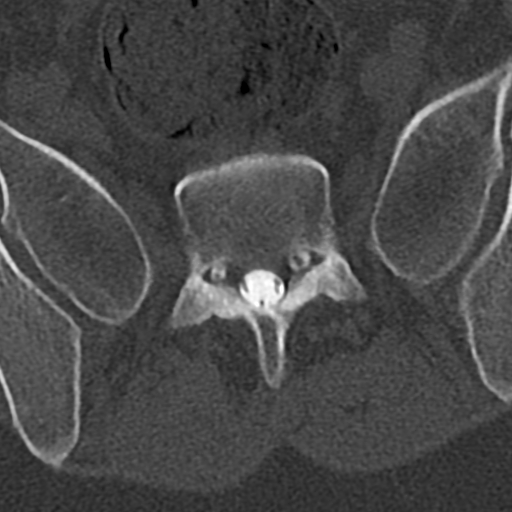

[Series 8: sag bone · sagittal · 0.37mm/px · 2 of 89 slices shown]
[im 30/89  bone]
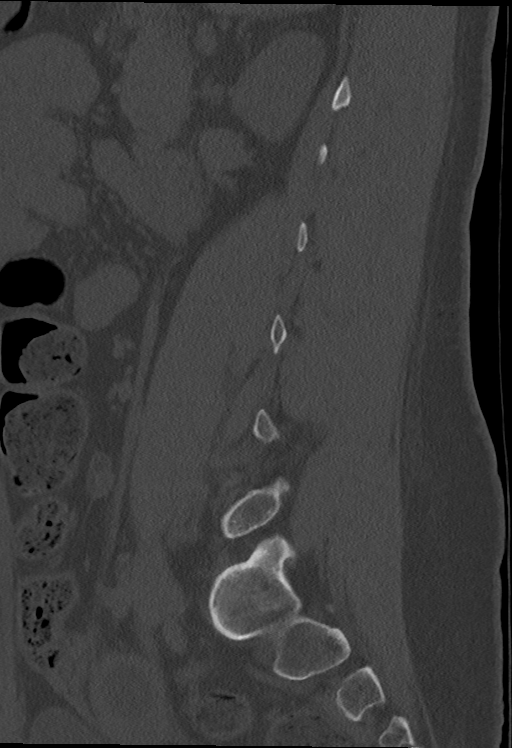
[im 59/89  bone]
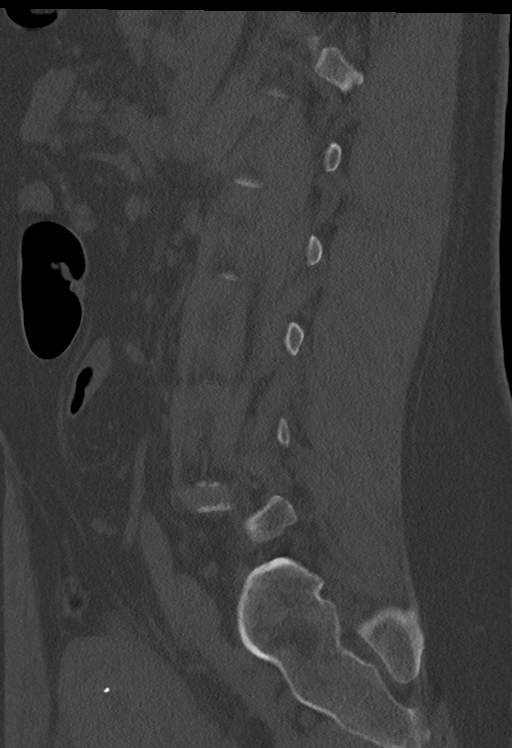

[3 of 35 positions shown; findings below may reference images not displayed]

FINDINGS: LUMBAR MYELOGRAM FINDINGS:

Normal lumbar segmentation. Good intrathecal contrast opacification.

Mild disc space loss and endplate spurring at both L4-L5 and L5-S1.
However, on the AP and oblique views there is no convincing spinal
or lateral recess stenosis. Supine lateral view demonstrates a mild
defect on the ventral thecal sac at L4-L5.

Upright lateral views also obtained in neutral, flexion, and
extension positioning. No abnormal motion in flexion/extension. Disc
space loss at L4-L5 and L5-S1 with a mild to moderate smooth defect
on the ventral thecal sac at the former. Borderline to mild spinal
stenosis at that level.

CT LUMBAR MYELOGRAM FINDINGS:

The initial post myelogram CT at [QG] hours demonstrates substantial
mixing artifact of the intrathecal contrast. Subsequently, the
imaging was repeated at [QG] hours (prone) and is of good quality.
Refer to sagittal series 17 and coronal series 18.

Negative visible noncontrast abdominal viscera. Partially visible
uterine IUD. Faint calcified aortic atherosclerosis. Lumbar
paraspinal soft tissues appear negative.

No acute osseous abnormality identified. Stable vertebral height and
alignment.

Normal myelographic appearance of the lower thoracic spinal cord.
Conus at T12-L1. Incidental small lipoma of the filum terminalis
redemonstrated (normal variant).

T11-T12: Negative.

T12-L1:  Negative.

L1-L2:  Negative.

L2-L3:  Negative.

L3-L4: Subtle right lateral recess disc bulge or protrusion is less
apparent today. No convincing stenosis.

L4-L5: Disc space loss, more so anteriorly. Circumferential disc
bulging here appears slightly eccentric to the left. The left
lateral recess appears mildly effaced (series 15, image 101). But
there is no other spinal or lateral recess stenosis. Mild facet
hypertrophy. Mild bilateral L4 foraminal stenosis appears greater on
the left related to disc and endplate spurring.

L5-S1: Chronic disc space loss. Mild residual circumferential disc
bulge and mild facet hypertrophy. Once again there appears to be
subtle asymmetric effacement of the left lateral recess (series 15,
image 115). The right lateral recess and spinal canal appear
normally patent. Mild L5 neural foraminal stenosis appears stable
and greater on the right.
IMPRESSION: 1. Plain myelographic images demonstrate disc related mild defect on
the ventral thecal sac at L4-L5, but without convincing lateral
recess stenosis and no abnormal motion in flexion/extension.

2. Post myelogram CT appears essentially stable to the [facility] MRI.
Chronic disc and endplate degeneration at L4-L5 and L5-S1 with no
spinal or convincing right lateral recess stenosis. There does
appear to be subtle effacement of the left lateral recess at both
levels.
There is mild left side L4 and right side L5 neural foraminal
stenosis related to disc bulge and endplate spurring.

## 2020-09-06 IMAGING — RF DG MYELOGRAM LUMBAR
9 series · 9 of 9 positions shown · non-contrast
Comparison: Lumbar MRI [DATE], and earlier.

CLINICAL DATA: 45-year-old female with prior micro discectomy at
L5-S1. Persistent low back pain and right leg radiculopathy, thought
to be S1 distribution.

EXAM:
LUMBAR MYELOGRAM
FLUOROSCOPY TIME:  0 minutes and 42 seconds
PROCEDURE:
Lumbar puncture and intrathecal contrast administration were
performed by Dr. VENE who will separately report for the
portion of the procedure. I personally supervised acquisition of the
myelogram images.
TECHNIQUE: Contiguous axial images were obtained through the Lumbar spine after
the intrathecal infusion of infusion. Coronal and sagittal
reconstructions were obtained of the axial image sets.

[Series 1: cp_standard · 0.19mm/px · 1 of 1 slices shown (1 of 2)]
[im 1/1]
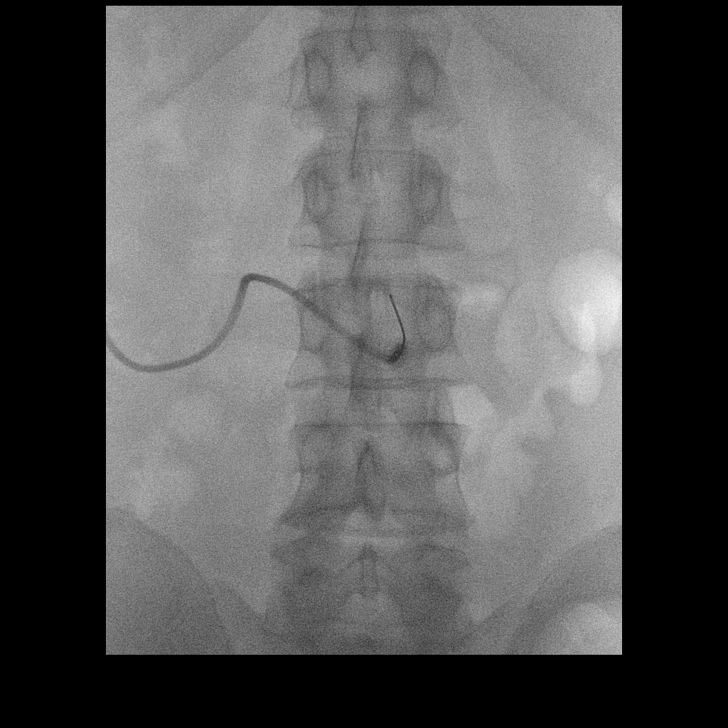

[Series 2: cp_standard · 0.19mm/px · 1 of 1 slices shown (2 of 2)]
[im 1/1]
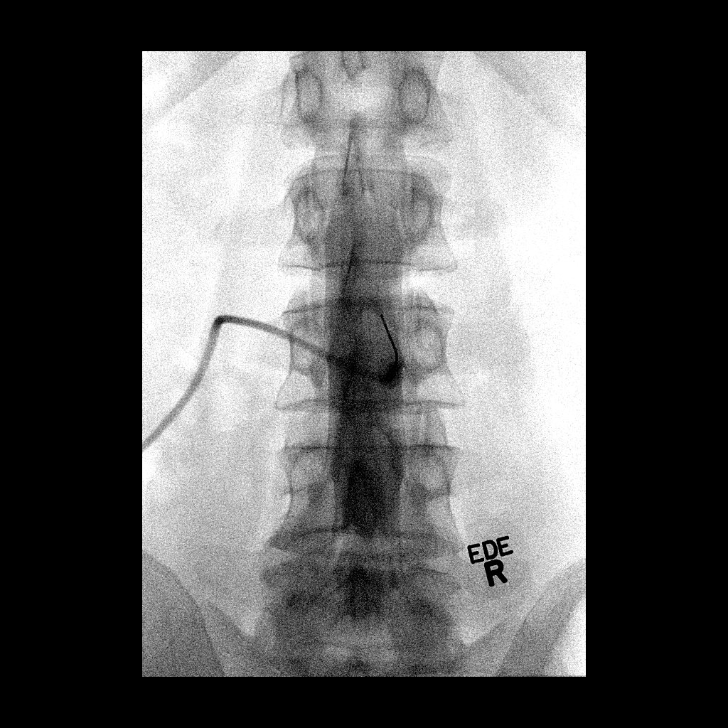

[Series 3: fluoro_myelogram_singleshot_bw · 0.20mm/px · 1 of 1 slices shown (1 of 7)]
[im 1/1]
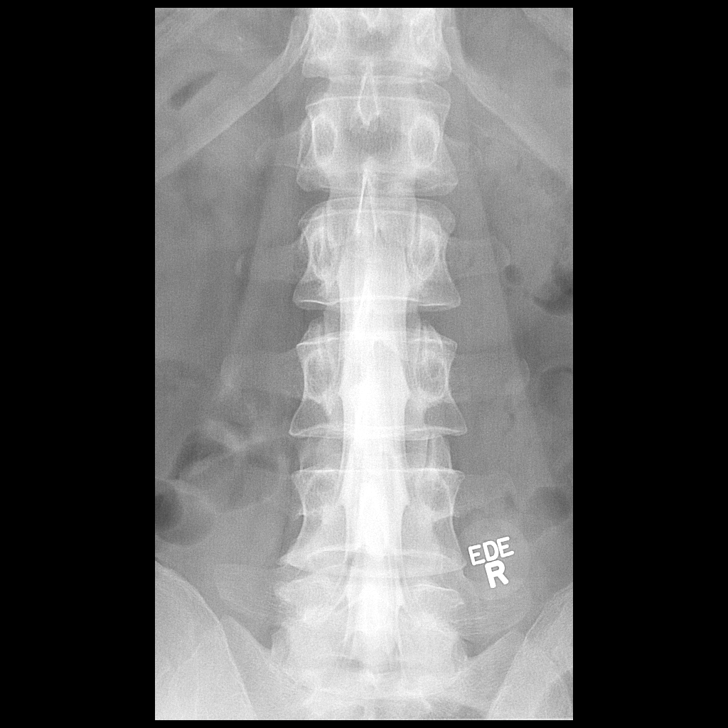

[Series 4: fluoro_myelogram_singleshot_bw · 0.20mm/px · 1 of 1 slices shown (2 of 7)]
[im 1/1]
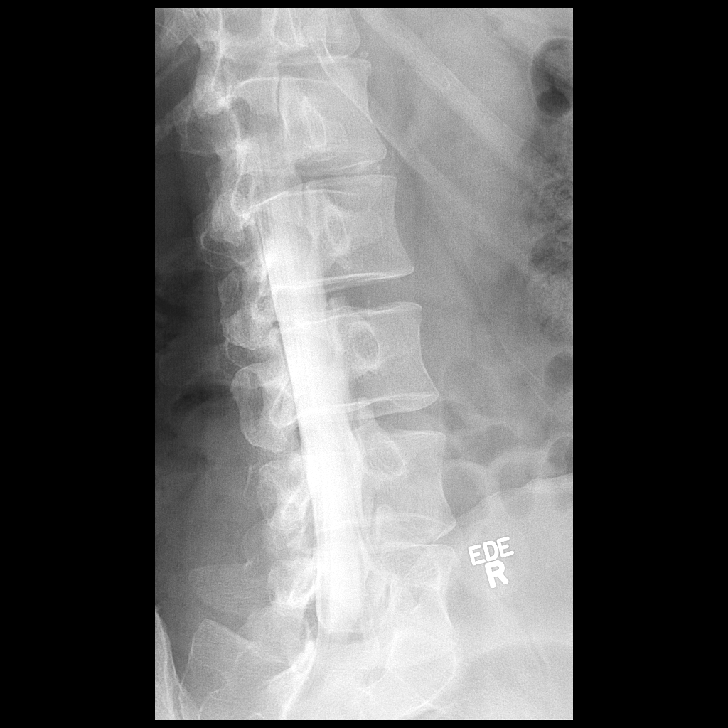

[Series 5: fluoro_myelogram_singleshot_bw · 0.20mm/px · 1 of 1 slices shown (3 of 7)]
[im 1/1]
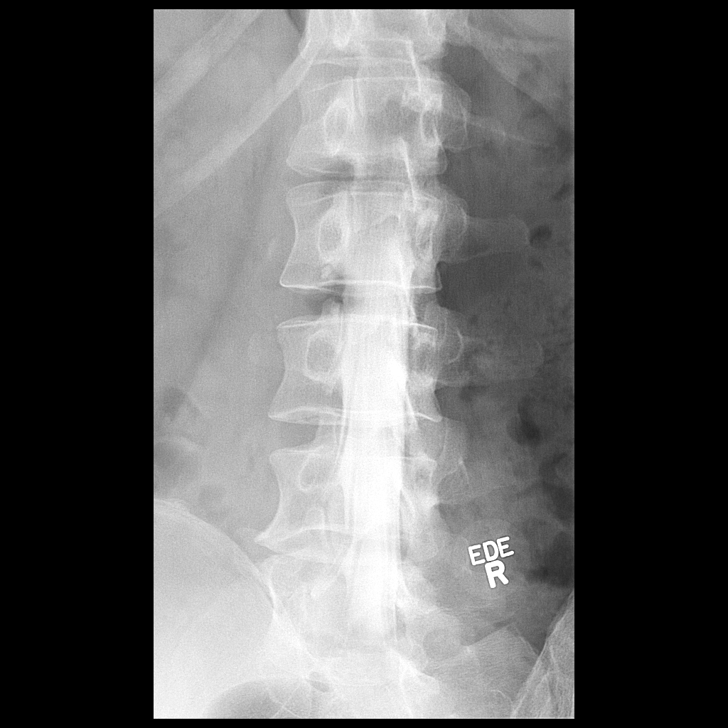

[Series 6: fluoro_myelogram_singleshot_bw · 0.19mm/px · 1 of 1 slices shown (4 of 7)]
[im 1/1]
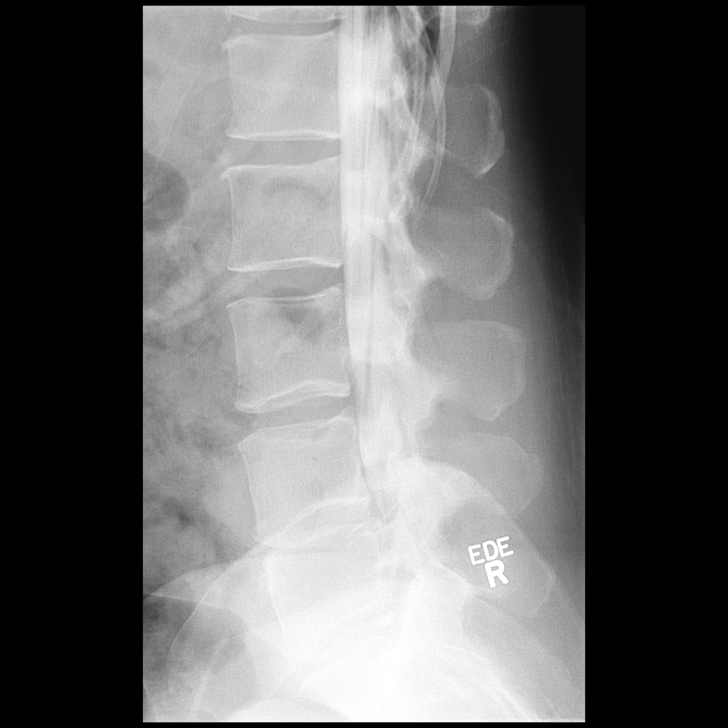

[Series 7: fluoro_myelogram_singleshot_bw · 0.17mm/px · 1 of 1 slices shown (5 of 7)]
[im 1/1]
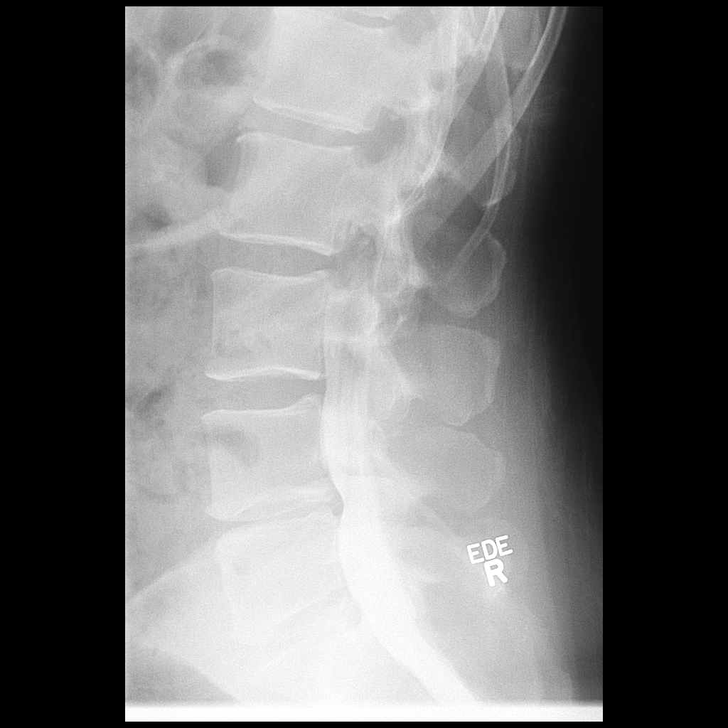

[Series 8: fluoro_myelogram_singleshot_bw · 0.17mm/px · 1 of 1 slices shown (6 of 7)]
[im 1/1]
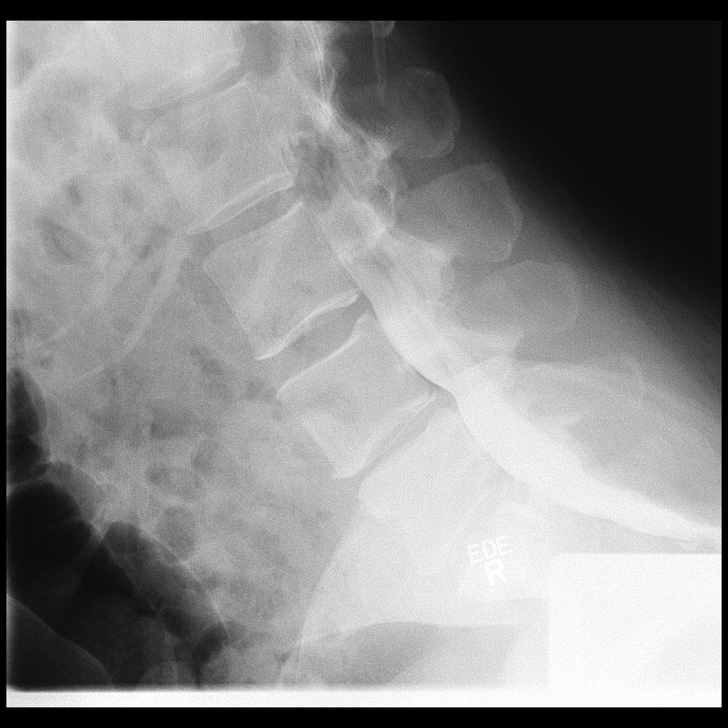

[Series 9: fluoro_myelogram_singleshot_bw · 0.17mm/px · 1 of 1 slices shown (7 of 7)]
[im 1/1]
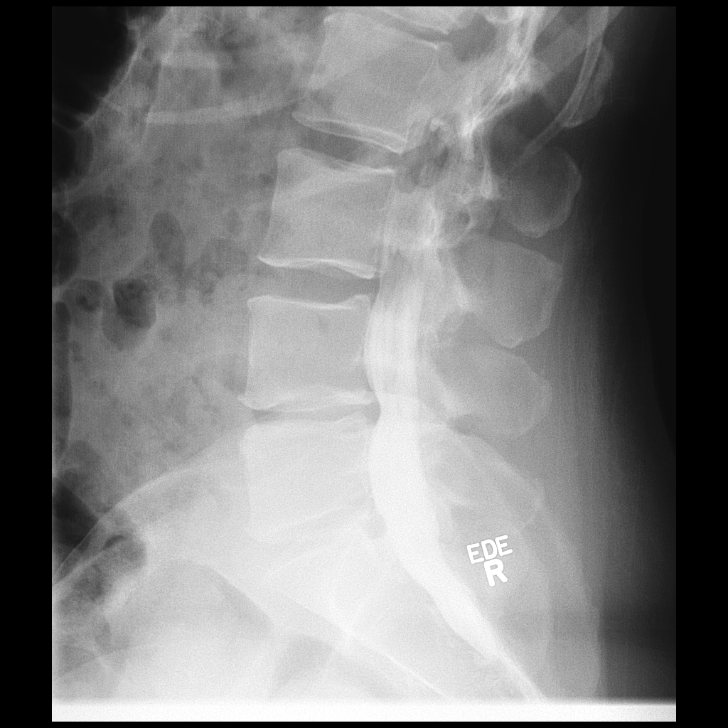

[9 of 9 positions shown; findings below may reference images not displayed]

FINDINGS: LUMBAR MYELOGRAM FINDINGS:

Normal lumbar segmentation. Good intrathecal contrast opacification.

Mild disc space loss and endplate spurring at both L4-L5 and L5-S1.
However, on the AP and oblique views there is no convincing spinal
or lateral recess stenosis. Supine lateral view demonstrates a mild
defect on the ventral thecal sac at L4-L5.

Upright lateral views also obtained in neutral, flexion, and
extension positioning. No abnormal motion in flexion/extension. Disc
space loss at L4-L5 and L5-S1 with a mild to moderate smooth defect
on the ventral thecal sac at the former. Borderline to mild spinal
stenosis at that level.

CT LUMBAR MYELOGRAM FINDINGS:

The initial post myelogram CT at [QG] hours demonstrates substantial
mixing artifact of the intrathecal contrast. Subsequently, the
imaging was repeated at [QG] hours (prone) and is of good quality.
Refer to sagittal series 17 and coronal series 18.

Negative visible noncontrast abdominal viscera. Partially visible
uterine IUD. Faint calcified aortic atherosclerosis. Lumbar
paraspinal soft tissues appear negative.

No acute osseous abnormality identified. Stable vertebral height and
alignment.

Normal myelographic appearance of the lower thoracic spinal cord.
Conus at T12-L1. Incidental small lipoma of the filum terminalis
redemonstrated (normal variant).

T11-T12: Negative.

T12-L1:  Negative.

L1-L2:  Negative.

L2-L3:  Negative.

L3-L4: Subtle right lateral recess disc bulge or protrusion is less
apparent today. No convincing stenosis.

L4-L5: Disc space loss, more so anteriorly. Circumferential disc
bulging here appears slightly eccentric to the left. The left
lateral recess appears mildly effaced (series 15, image 101). But
there is no other spinal or lateral recess stenosis. Mild facet
hypertrophy. Mild bilateral L4 foraminal stenosis appears greater on
the left related to disc and endplate spurring.

L5-S1: Chronic disc space loss. Mild residual circumferential disc
bulge and mild facet hypertrophy. Once again there appears to be
subtle asymmetric effacement of the left lateral recess (series 15,
image 115). The right lateral recess and spinal canal appear
normally patent. Mild L5 neural foraminal stenosis appears stable
and greater on the right.
IMPRESSION: 1. Plain myelographic images demonstrate disc related mild defect on
the ventral thecal sac at L4-L5, but without convincing lateral
recess stenosis and no abnormal motion in flexion/extension.

2. Post myelogram CT appears essentially stable to the [facility] MRI.
Chronic disc and endplate degeneration at L4-L5 and L5-S1 with no
spinal or convincing right lateral recess stenosis. There does
appear to be subtle effacement of the left lateral recess at both
levels.
There is mild left side L4 and right side L5 neural foraminal
stenosis related to disc bulge and endplate spurring.

## 2020-09-06 MED ORDER — IOHEXOL 180 MG/ML  SOLN
20.0000 mL | Freq: Once | INTRAMUSCULAR | Status: AC | PRN
  Administered 2020-09-06: 12 mL via INTRATHECAL

## 2020-09-06 MED ORDER — LIDOCAINE HCL (PF) 1 % IJ SOLN
5.0000 mL | Freq: Once | INTRAMUSCULAR | Status: AC
  Administered 2020-09-06: 5 mL via INTRADERMAL

## 2020-09-06 MED ORDER — OXYCODONE-ACETAMINOPHEN 5-325 MG PO TABS
1.0000 | ORAL_TABLET | ORAL | Status: DC | PRN
  Administered 2020-09-06: 2 via ORAL

## 2020-09-06 MED ORDER — ONDANSETRON HCL 4 MG/2ML IJ SOLN: 4.0000 mg | Freq: Four times a day (QID) | INTRAMUSCULAR | Status: DC | PRN

## 2020-09-06 MED ORDER — OXYCODONE-ACETAMINOPHEN 5-325 MG PO TABS
ORAL_TABLET | ORAL | Status: AC
  Filled 2020-09-06: qty 2

## 2020-09-06 MED ORDER — DIAZEPAM 5 MG PO TABS
10.0000 mg | ORAL_TABLET | Freq: Once | ORAL | Status: AC
  Administered 2020-09-06: 10 mg via ORAL
  Filled 2020-09-06: qty 2

## 2020-09-06 NOTE — Procedures (Signed)
Tonya Mcconnell is a 45 year old individual whose had significant back and right leg pain she has had a herniated nucleus pulposus L5-S1 and has had 2 microdiscectomies.  She is suffering with chronic back pain and radicular pain severe on the right side which is limiting her activity.  MRI demonstrates that she has some modest degenerative changes at L4-5 and L3-4 above her L5-S1 disc which is significantly degenerated myelogram and postmyelogram CAT scan has now been suggested to obtain a dynamic study to see what is occurring when she moves about.  Pre op Dx: Right lumbar radiculopathy chronic with back pain Post op Dx: Same Procedure: Lumbar myelogram Surgeon: Sem Mccaughey Puncture level: L2-3 Fluid color: Clear colorless Injection: Isovue 180, 12 mL Findings: Moderate disc bulge at L4-L5 no listhesis with mobility in flexion extension.  Degenerative changes L5-S1 also noted.  Further evaluation with CT scanning.

## 2020-09-13 ENCOUNTER — Other Ambulatory Visit: Payer: Self-pay

## 2020-09-13 ENCOUNTER — Ambulatory Visit
Admission: RE | Admit: 2020-09-13 | Discharge: 2020-09-13 | Disposition: A | Discharge status: Home / Self Care | Payer: No Typology Code available for payment source | Source: Ambulatory Visit | Attending: Home Modifications | Admitting: Home Modifications

## 2020-09-13 DIAGNOSIS — M7989 Other specified soft tissue disorders: Secondary | ICD-10-CM

## 2020-09-13 IMAGING — US US EXTREM UP *R* LTD
1 series · 7 of 7 positions shown · non-contrast
Comparison: None.

CLINICAL DATA: Soft tissue mass

EXAM:
ULTRASOUND right UPPER EXTREMITY LIMITED
TECHNIQUE: Ultrasound examination of the upper extremity soft tissues was
performed in the area of clinical concern.

[Series 1: us extrem up *right* ltd · 0.07mm/px · 7 of 7 slices shown]
[im 1/7]
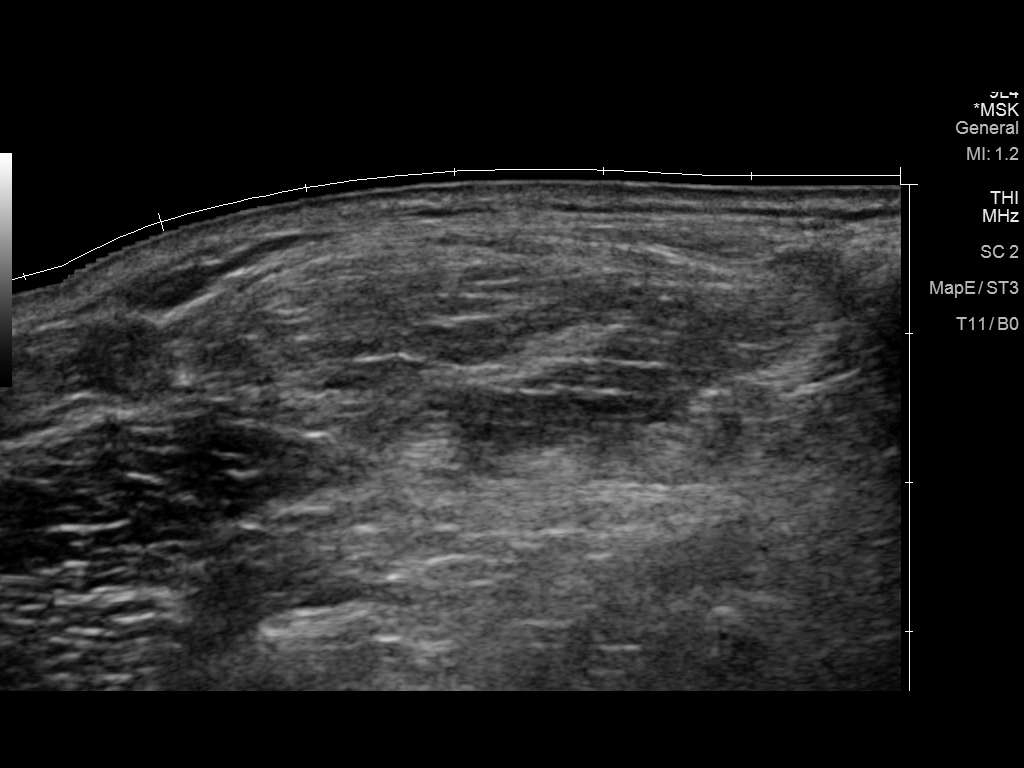
[im 2/7]
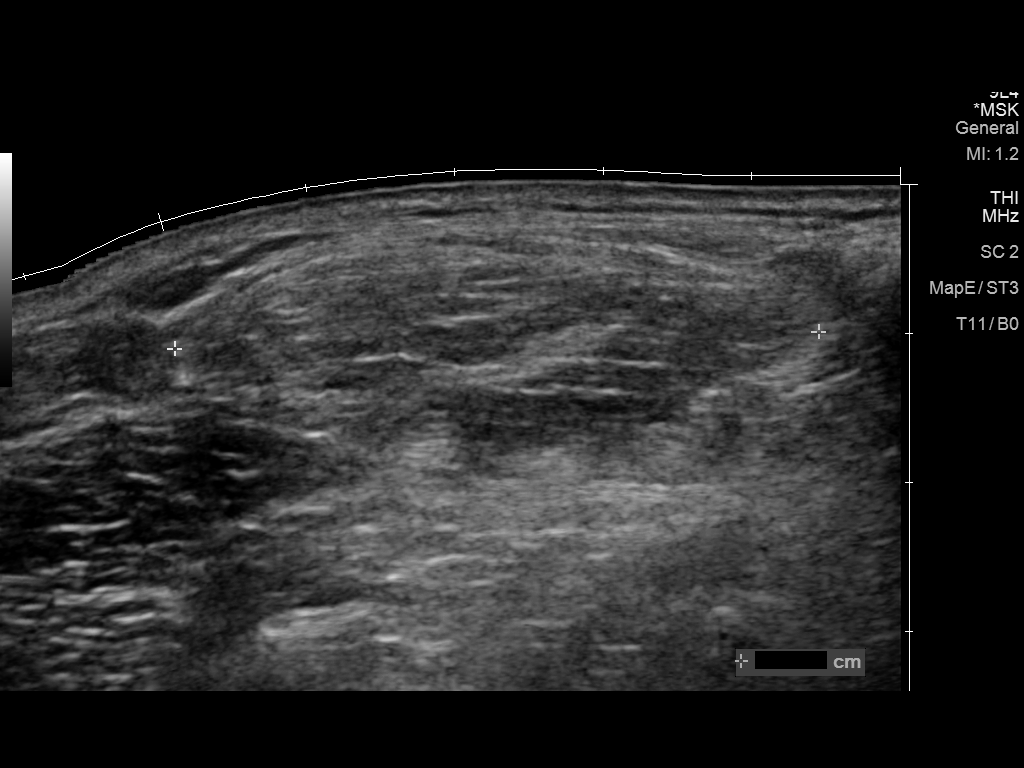
[im 3/7]
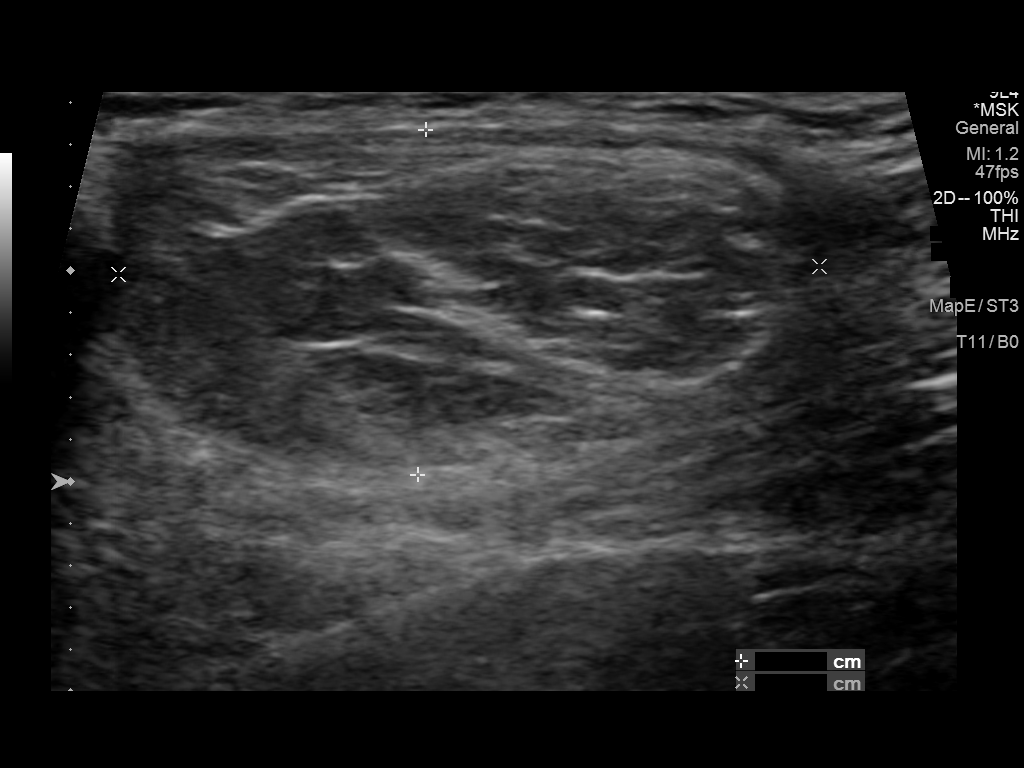
[im 4/7]
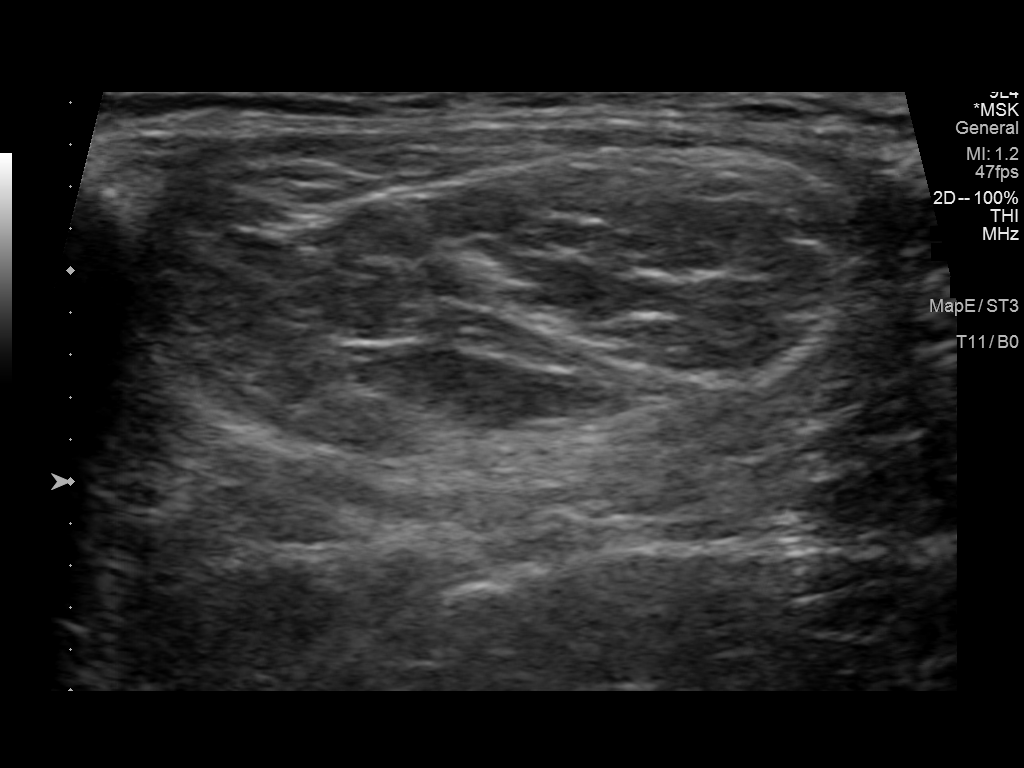
[im 5/7]
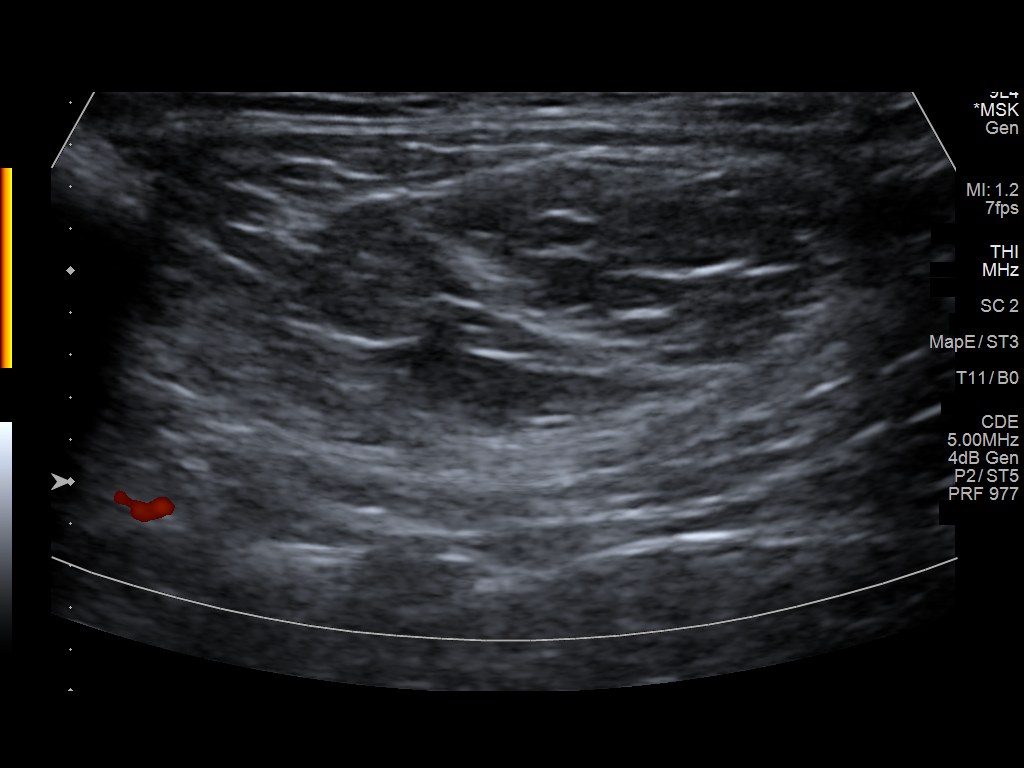
[im 6/7]
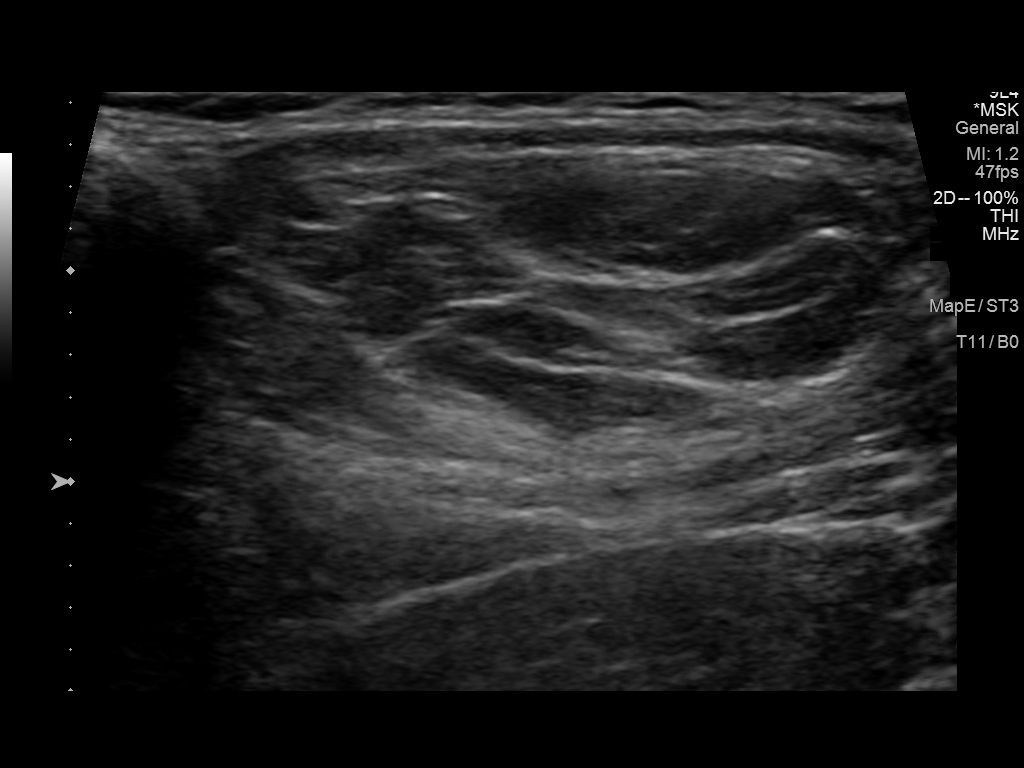
[im 7/7]
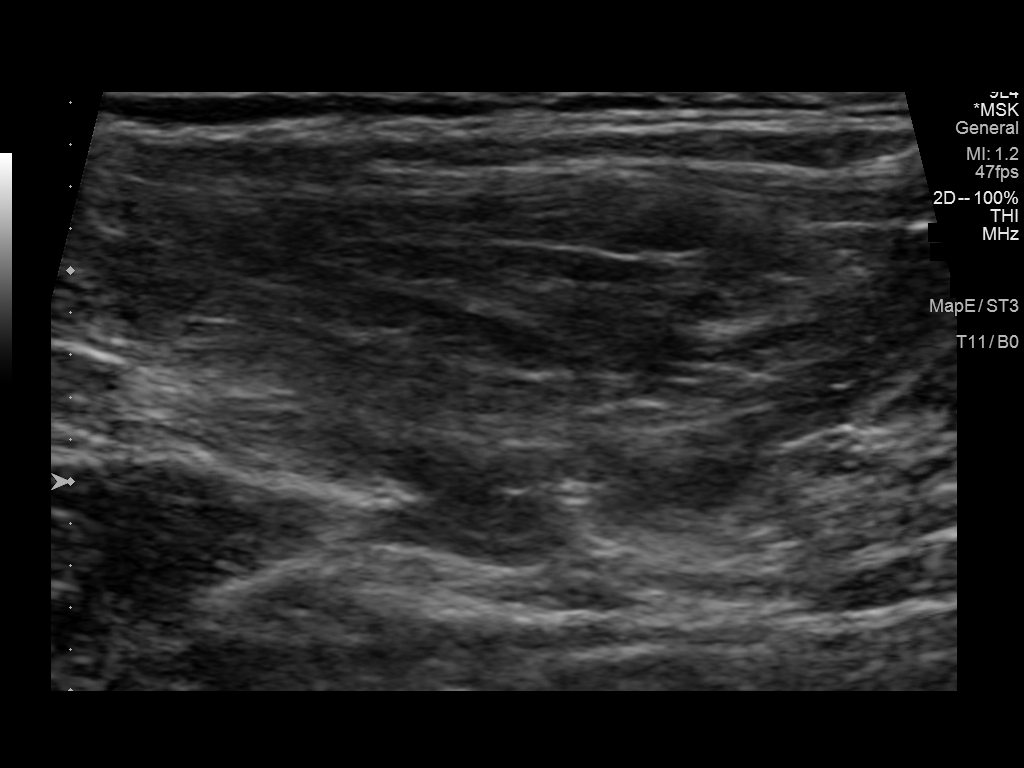

[7 of 7 positions shown; findings below may reference images not displayed]

FINDINGS: In the palpable area of concern, there is an isoechoic mass
measuring 3.3 x 1.6 by 4.3 cm. This is subcutaneous with no internal
vascularity. There are no cystic components.
IMPRESSION: 3.3 x 1.6 x 4.3 cm subcutaneous mass in the palpable area of concern
compatible with a lipoma. Recommend clinical follow-up.
